# Patient Record
Sex: Male | Born: 1970 | Race: White | Hispanic: No | Marital: Married | Smoking: Never smoker
Health system: Southern US, Community
[De-identification: ages and names within clinical notes are randomized; demographics above are authoritative.]

## PROBLEM LIST (undated history)

## (undated) DIAGNOSIS — K4090 Unilateral inguinal hernia, without obstruction or gangrene, not specified as recurrent: Secondary | ICD-10-CM

## (undated) DIAGNOSIS — F32A Depression, unspecified: Secondary | ICD-10-CM

## (undated) DIAGNOSIS — F419 Anxiety disorder, unspecified: Secondary | ICD-10-CM

## (undated) DIAGNOSIS — K37 Unspecified appendicitis: Secondary | ICD-10-CM

## (undated) HISTORY — DX: Unilateral inguinal hernia, without obstruction or gangrene, not specified as recurrent: K40.90

## (undated) HISTORY — DX: Unspecified appendicitis: K37

## (undated) HISTORY — PX: INGUINAL HERNIA REPAIR: SUR1180

## (undated) HISTORY — DX: Depression, unspecified: F32.A

## (undated) HISTORY — DX: Anxiety disorder, unspecified: F41.9

## (undated) HISTORY — PX: APPENDECTOMY: SHX54

---

## 2000-01-04 ENCOUNTER — Encounter: Admission: RE | Admit: 2000-01-04 | Discharge: 2000-01-04 | Payer: Self-pay | Admitting: Family Medicine

## 2000-01-04 ENCOUNTER — Encounter: Payer: Self-pay | Admitting: Family Medicine

## 2000-03-28 ENCOUNTER — Emergency Department (HOSPITAL_COMMUNITY): Admission: EM | Admit: 2000-03-28 | Discharge: 2000-03-28 | Payer: Self-pay | Admitting: Emergency Medicine

## 2004-05-07 ENCOUNTER — Ambulatory Visit (HOSPITAL_COMMUNITY): Admission: RE | Admit: 2004-05-07 | Discharge: 2004-05-07 | Payer: Self-pay | Admitting: Orthopedic Surgery

## 2004-11-01 ENCOUNTER — Emergency Department (HOSPITAL_COMMUNITY): Admission: EM | Admit: 2004-11-01 | Discharge: 2004-11-02 | Payer: Self-pay | Admitting: Emergency Medicine

## 2004-11-05 ENCOUNTER — Encounter: Admission: RE | Admit: 2004-11-05 | Discharge: 2004-11-05 | Payer: Self-pay | Admitting: Family Medicine

## 2006-02-15 ENCOUNTER — Ambulatory Visit: Payer: Self-pay | Admitting: Family Medicine

## 2006-12-21 ENCOUNTER — Ambulatory Visit: Payer: Self-pay | Admitting: Family Medicine

## 2007-08-02 ENCOUNTER — Ambulatory Visit: Payer: Self-pay | Admitting: Family Medicine

## 2008-07-17 ENCOUNTER — Ambulatory Visit: Payer: Self-pay | Admitting: Family Medicine

## 2008-08-18 ENCOUNTER — Ambulatory Visit: Payer: Self-pay | Admitting: Family Medicine

## 2009-10-29 ENCOUNTER — Ambulatory Visit: Payer: Self-pay | Admitting: Family Medicine

## 2010-04-01 ENCOUNTER — Ambulatory Visit: Payer: Self-pay | Admitting: Family Medicine

## 2013-07-25 ENCOUNTER — Encounter: Payer: Self-pay | Admitting: Family Medicine

## 2013-07-25 ENCOUNTER — Ambulatory Visit (INDEPENDENT_AMBULATORY_CARE_PROVIDER_SITE_OTHER): Payer: BC Managed Care – PPO | Admitting: Family Medicine

## 2013-07-25 VITALS — BP 108/82 | HR 80 | Wt 222.0 lb

## 2013-07-25 DIAGNOSIS — F419 Anxiety disorder, unspecified: Secondary | ICD-10-CM

## 2013-07-25 DIAGNOSIS — D229 Melanocytic nevi, unspecified: Secondary | ICD-10-CM

## 2013-07-25 DIAGNOSIS — D239 Other benign neoplasm of skin, unspecified: Secondary | ICD-10-CM

## 2013-07-25 DIAGNOSIS — H029 Unspecified disorder of eyelid: Secondary | ICD-10-CM

## 2013-07-25 DIAGNOSIS — F341 Dysthymic disorder: Secondary | ICD-10-CM

## 2013-07-25 DIAGNOSIS — F329 Major depressive disorder, single episode, unspecified: Secondary | ICD-10-CM | POA: Insufficient documentation

## 2013-07-25 DIAGNOSIS — Z0000 Encounter for general adult medical examination without abnormal findings: Secondary | ICD-10-CM

## 2013-07-25 DIAGNOSIS — F325 Major depressive disorder, single episode, in full remission: Secondary | ICD-10-CM | POA: Insufficient documentation

## 2013-07-25 DIAGNOSIS — F32A Depression, unspecified: Secondary | ICD-10-CM

## 2013-07-25 LAB — CBC WITH DIFFERENTIAL/PLATELET
Basophils Absolute: 0.1 10*3/uL (ref 0.0–0.1)
Basophils Relative: 1 % (ref 0–1)
Eosinophils Absolute: 0.1 10*3/uL (ref 0.0–0.7)
Eosinophils Relative: 1 % (ref 0–5)
HCT: 46.7 % (ref 39.0–52.0)
Hemoglobin: 16.6 g/dL (ref 13.0–17.0)
Lymphocytes Relative: 39 % (ref 12–46)
Lymphs Abs: 2.8 10*3/uL (ref 0.7–4.0)
MCH: 30.5 pg (ref 26.0–34.0)
MCHC: 35.5 g/dL (ref 30.0–36.0)
MCV: 85.7 fL (ref 78.0–100.0)
Monocytes Absolute: 0.7 10*3/uL (ref 0.1–1.0)
Monocytes Relative: 10 % (ref 3–12)
Neutro Abs: 3.5 10*3/uL (ref 1.7–7.7)
Neutrophils Relative %: 49 % (ref 43–77)
Platelets: 300 10*3/uL (ref 150–400)
RBC: 5.45 MIL/uL (ref 4.22–5.81)
RDW: 13.9 % (ref 11.5–15.5)
WBC: 7.2 10*3/uL (ref 4.0–10.5)

## 2013-07-25 LAB — COMPREHENSIVE METABOLIC PANEL
ALT: 36 U/L (ref 0–53)
AST: 24 U/L (ref 0–37)
Albumin: 4.5 g/dL (ref 3.5–5.2)
Alkaline Phosphatase: 86 U/L (ref 39–117)
BUN: 17 mg/dL (ref 6–23)
CO2: 29 mEq/L (ref 19–32)
Calcium: 9.8 mg/dL (ref 8.4–10.5)
Chloride: 101 mEq/L (ref 96–112)
Creat: 0.87 mg/dL (ref 0.50–1.35)
Glucose, Bld: 91 mg/dL (ref 70–99)
Potassium: 4.5 mEq/L (ref 3.5–5.3)
Sodium: 138 mEq/L (ref 135–145)
Total Bilirubin: 0.7 mg/dL (ref 0.2–1.2)
Total Protein: 7.8 g/dL (ref 6.0–8.3)

## 2013-07-25 LAB — HEMOCCULT GUIAC POC 1CARD (OFFICE)

## 2013-07-25 LAB — LIPID PANEL
CHOL/HDL RATIO: 4.5 ratio
CHOLESTEROL: 170 mg/dL (ref 0–200)
HDL: 38 mg/dL — AB (ref >39)
LDL Cholesterol: 114 mg/dL — ABNORMAL HIGH (ref 0–99)
Triglycerides: 90 mg/dL (ref <150)
VLDL: 18 mg/dL (ref 0–40)

## 2013-07-25 NOTE — Progress Notes (Signed)
   Subjective:    Patient ID: Tyrone Graham, male    DOB: Feb 28, 1971, 43 y.o.   MRN: [id number]  HPI He is here for complete examination. He has not been here in several years but he does get routine followup for his underlying anxiety/depression. He continues to do quite well on his Zoloft. He also has a lesion present on his upper eyelid in several skin lesions he would like evaluated. He otherwise has no concerns or questions. His family and social history were reviewed. Marriage is going well. He has 2 daughters.   Review of Systems  All other systems reviewed and are negative.       Objective:   Physical Exam BP 108/82  Pulse 80  Wt 222 lb (100.699 kg)  General Appearance:    Alert, cooperative, no distress, appears stated age  Head:    Normocephalic, without obvious abnormality, atraumatic  Eyes:    P a 2-5 mm lesion is noted on the left upper eyelid at the eyelash level.ERRL, conjunctiva/corneas clear, EOM's intact, fundi    Benign.  Ears:    Normal TM's and external ear canals  Nose:   Nares normal, mucosa normal, no drainage or sinus   tenderness  Throat:   Lips, mucosa, and tongue normal; teeth and gums normal  Neck:   Supple, no lymphadenopathy;  thyroid:  no   enlargement/tenderness/nodules; no carotid   bruit or JVD  Back:    Spine nontender, no curvature, ROM normal, no CVA     tenderness  Lungs:     Clear to auscultation bilaterally without wheezes, rales or     ronchi; respirations unlabored  Chest Wall:    No tenderness or deformity   Heart:    Regular rate and rhythm, S1 and S2 normal, no murmur, rub   or gallop  Breast Exam:    No chest wall tenderness, masses or gynecomastia  Abdomen:     Soft, non-tender, nondistended, normoactive bowel sounds,    no masses, no hepatosplenomegaly  Genitalia:    Normal male external genitalia without lesions.  Testicles without masses.  No inguinal hernias.  Rectal:    Normal sphincter tone, no masses or tenderness;  guaiac negative stool.  Prostate smooth, no nodules, not enlarged.  Extremities:   No clubbing, cyanosis or edema  Pulses:   2+ and symmetric all extremities  Skin:   Skin color, texture, turgor normal, no rashes, multiple moles were evaluated and are benign   Lymph nodes:   Cervical, supraclavicular, and axillary nodes normal  Neurologic:   CNII-XII intact, normal strength, sensation and gait; reflexes 2+ and symmetric throughout          Psych:   Normal mood, affect, hygiene and grooming.          Assessment & Plan:  Routine general medical examination at a health care facility - Plan: CBC with Differential, Comprehensive metabolic panel, Lipid panel, Hemoccult - 1 Card (office)  Lesion of upper eyelid - Plan: Ambulatory referral to Ophthalmology  Benign mole  Anxiety and depression  he will continue on his present medications. Discussed the possibility of switching to my care concerning the renewal of Zoloft and he will consider this.

## 2015-01-09 ENCOUNTER — Ambulatory Visit (INDEPENDENT_AMBULATORY_CARE_PROVIDER_SITE_OTHER): Payer: 59 | Admitting: Family Medicine

## 2015-01-09 ENCOUNTER — Encounter: Payer: Self-pay | Admitting: Family Medicine

## 2015-01-09 VITALS — BP 112/70 | HR 81 | Wt 225.0 lb

## 2015-01-09 DIAGNOSIS — R0781 Pleurodynia: Secondary | ICD-10-CM | POA: Diagnosis not present

## 2015-01-09 NOTE — Progress Notes (Signed)
   Subjective:    Patient ID: Tyrone Graham, male    DOB: 28-Mar-1971, 44 y.o.   MRN: [id number]  HPI He awoke this morning with left upper quadrant pain. The pain is made worse with deep breathing as well as left lateral motion. He does not remember any injury. No fever, chills, chest congestion, nausea, vomiting or diarrhea.   Review of Systems     Objective:   Physical Exam Urgent and in no distress. Cardiac exam shows regular rhythm without murmurs or gallops. Lungs are clear to auscultation. No chest wall tenderness however splinting of the left lower ribs did relieve his discomfort. He did note slight discomfort with breathing regularly. No abdominal tenderness.       Assessment & Plan:  Rib pain on left side  I explained that the splinting of the ribs did indicate some musculoskeletal type of problem rather than anything significant. Recommend anti-inflammatory of choice and call if further trouble.

## 2015-12-10 ENCOUNTER — Encounter: Payer: Self-pay | Admitting: Family Medicine

## 2015-12-10 ENCOUNTER — Ambulatory Visit (INDEPENDENT_AMBULATORY_CARE_PROVIDER_SITE_OTHER): Payer: 59 | Admitting: Family Medicine

## 2015-12-10 VITALS — BP 124/80 | HR 84 | Wt 214.6 lb

## 2015-12-10 DIAGNOSIS — R29898 Other symptoms and signs involving the musculoskeletal system: Secondary | ICD-10-CM

## 2015-12-10 NOTE — Progress Notes (Signed)
   Subjective:    Patient ID: Tyrone Graham, male    DOB: [birth year], 45 y.o.   MRN: [id number]  HPI He complains of a 10 day history of the feeling is that the left leg is heavy. He has had no change in his physical activities. He has had no numbness, tingling, weakness, falls, back pain, chest pain, shortness of breath. He has been able to play tennis without any difficulties including falling.   Review of Systems     Objective:   Physical Exam Alert and in no distress. Full hip, knee and ankle motion. Negative straight leg raising. Strength and sensation is normal. Pulses are normal. DTRs normal.       Assessment & Plan:  Left leg weakness I reassured him that I found nothing in particular to worry about. Explained all the things that he did not have. Recommend watchful waiting and patient attention to any symptoms that might possibly change.

## 2016-01-07 ENCOUNTER — Encounter: Payer: Self-pay | Admitting: Family Medicine

## 2016-01-07 ENCOUNTER — Ambulatory Visit (INDEPENDENT_AMBULATORY_CARE_PROVIDER_SITE_OTHER): Payer: 59 | Admitting: Family Medicine

## 2016-01-07 VITALS — BP 104/70 | HR 83 | Wt 209.0 lb

## 2016-01-07 DIAGNOSIS — R29898 Other symptoms and signs involving the musculoskeletal system: Secondary | ICD-10-CM | POA: Diagnosis not present

## 2016-01-07 NOTE — Progress Notes (Signed)
   Subjective:    Patient ID: Tyrone Graham, male    DOB: 10/26/1970, 45 y.o.   MRN: [id number]  HPI He is here again complaining of a tight sensation in the left leg . He has had no blurred vision, double vision, nausea, headache, bowel or bladder trouble or decreased functional ability. He states that he is able to play tennis without difficulty. He did have one episode of being lightheaded. This is now starting to psychologically way on him in that he is starting to play the "what if" game  Review of Systems     Objective:   Physical Exam Alert and in no distress. EOMI. Other renal nerves grossly intact. DTRs and motor normal. Pulses normal.       Assessment & Plan:  Left leg weakness - Plan: Ambulatory referral to Neurology As he continues to have a concern about this, I will refer to neurology for another opinion. I again tried to reassure him that since he is able to function without difficulty and there are no focal neurologic findings I don't think that he is in any danger.

## 2016-01-11 ENCOUNTER — Encounter: Payer: Self-pay | Admitting: Family Medicine

## 2016-02-01 ENCOUNTER — Ambulatory Visit (INDEPENDENT_AMBULATORY_CARE_PROVIDER_SITE_OTHER): Payer: 59 | Admitting: Neurology

## 2016-02-01 ENCOUNTER — Encounter: Payer: Self-pay | Admitting: Neurology

## 2016-02-01 VITALS — BP 110/70 | HR 78 | Ht 75.0 in | Wt 212.3 lb

## 2016-02-01 DIAGNOSIS — F418 Other specified anxiety disorders: Secondary | ICD-10-CM

## 2016-02-01 DIAGNOSIS — M62838 Other muscle spasm: Secondary | ICD-10-CM

## 2016-02-01 NOTE — Progress Notes (Signed)
[facility] Clinic Note - Initial Visit   Date: 02/01/16  Tyrone Graham MRN: [id number] DOB: 1970-09-05   Dear Dr. Redmond School:  Thank you for your kind referral of Tyrone Graham for consultation of left leg weakness. Although his history is well known to you, please allow [place] to reiterate it for the purpose of our medical record. The patient was accompanied to the clinic by self.   History of Present Illness: Tyrone Graham is a 45 y.o. right-handed Caucasian male with no prior medical problems presenting for evaluation of left leg weakness.    Starting around early June, he began having a tight sensation of the left lower leg.  He does not have any pain, tingling/numbness, weakness, cramps, or bowel/bladder problems.  Symptoms are constant, no identifiable triggers. He has noticed that when he is distracted, he does not notice the symptoms.  He initially thought it may be because he stands a lot at work.  He feels that his walking is off, but denies imbalance.  No falls. He stays active playing tennis 1-2 times per week, which does not have any effect on his symptoms.    He reports witnessing a colleague have a seizures at work earlier this year and it has been bothering him.  He admits to thinking a lot about his leg and researching symptoms online. He is worried about a brain tumor causing his leg discomfort.  He has previously seen behavior therapy several years ago and recognizes that he has a lot of anxiety about symptoms.   Out-side paper records, electronic medical record, and images have been reviewed where available and summarized as:  Lab Results  Component Value Date   NA 138 07/25/2013   K 4.5 07/25/2013   CL 101 07/25/2013   CO2 29 07/25/2013   Lab Results  Component Value Date   WBC 7.2 07/25/2013   HGB 16.6 07/25/2013   HCT 46.7 07/25/2013   MCV 85.7 07/25/2013   PLT 300 07/25/2013    Past Medical History:  Diagnosis  Date  . Appendicitis     Past Surgical History:  Procedure Laterality Date  . APPENDECTOMY       Medications:  No outpatient encounter prescriptions on file as of 02/01/2016.   No facility-administered encounter medications on file as of 02/01/2016.      Allergies: No Known Allergies  Family History: Family History  Problem Relation Age of Onset  . Mental illness Mother   . Diabetes Father   . Heart disease Father   . Healthy Daughter     Social History: Social History  Substance Use Topics  . Smoking status: Never Smoker  . Smokeless tobacco: Never Used  . Alcohol use Yes     Comment: rare   Social History   Social History Narrative   Lives with wife in a one story home.  Has 2 children.     Works as a Financial planner.     Education: college.    Review of Systems:  CONSTITUTIONAL: No fevers, chills, night sweats, or weight loss.   EYES: No visual changes or eye pain ENT: No hearing changes.  No history of nose bleeds.   RESPIRATORY: No cough, wheezing and shortness of breath.   CARDIOVASCULAR: Negative for chest pain, and palpitations.   GI: Negative for abdominal discomfort, blood in stools or black stools.  No recent change in bowel habits.   GU:  No history of incontinence.   MUSCLOSKELETAL: No  history of joint pain or swelling.  No myalgias.   SKIN: Negative for lesions, rash, and itching.   HEMATOLOGY/ONCOLOGY: Negative for prolonged bleeding, bruising easily, and swollen nodes.  No history of cancer.   ENDOCRINE: Negative for cold or heat intolerance, polydipsia or goiter.   PSYCH:  No depression + anxiety symptoms.   NEURO: As Above.   Vital Signs:  BP 110/70   Pulse 78   Ht 6\' 3"  (1.905 m)   Wt 212 lb 5 oz (96.3 kg)   SpO2 97%   BMI 26.54 kg/m  Pain Scale: 0 on a scale of 0-10   General Medical Exam:   General:  Well appearing, comfortable.   Eyes/ENT: see cranial nerve examination.   Neck: No masses appreciated.  Full range of motion  without tenderness.  No carotid bruits. Respiratory:  Clear to auscultation, good air entry bilaterally.   Cardiac:  Regular rate and rhythm, no murmur.   Extremities:  No deformities, edema, or skin discoloration.  Skin:  No rashes or lesions.  Neurological Exam: MENTAL STATUS including orientation to time, place, person, recent and remote memory, attention span and concentration, language, and fund of knowledge is normal.  Speech is not dysarthric.  CRANIAL NERVES: II:  No visual field defects.  Unremarkable fundi.   III-IV-VI: Pupils equal round and reactive to light.  Normal conjugate, extra-ocular eye movements in all directions of gaze.  No nystagmus.  No ptosis.   V:  Normal facial sensation.     VII:  Normal facial symmetry and movements.   VIII:  Normal hearing and vestibular function.   IX-X:  Normal palatal movement.   XI:  Normal shoulder shrug and head rotation.   XII:  Normal tongue strength and range of motion, no deviation or fasciculation.  MOTOR:  No atrophy, fasciculations or abnormal movements. No muscle tenderness.  No pronator drift.  Tone is normal.    Right Upper Extremity:    Left Upper Extremity:    Deltoid  5/5   Deltoid  5/5   Biceps  5/5   Biceps  5/5   Triceps  5/5   Triceps  5/5   Wrist extensors  5/5   Wrist extensors  5/5   Wrist flexors  5/5   Wrist flexors  5/5   Finger extensors  5/5   Finger extensors  5/5   Finger flexors  5/5   Finger flexors  5/5   Dorsal interossei  5/5   Dorsal interossei  5/5   Abductor pollicis  5/5   Abductor pollicis  5/5   Tone (Ashworth scale)  0  Tone (Ashworth scale)  0   Right Lower Extremity:    Left Lower Extremity:    Hip flexors  5/5   Hip flexors  5/5   Hip extensors  5/5   Hip extensors  5/5   Knee flexors  5/5   Knee flexors  5/5   Knee extensors  5/5   Knee extensors  5/5   Dorsiflexors  5/5   Dorsiflexors  5/5   Plantarflexors  5/5   Plantarflexors  5/5   Toe extensors  5/5   Toe extensors  5/5   Toe  flexors  5/5   Toe flexors  5/5   Tone (Ashworth scale)  0  Tone (Ashworth scale)  0   MSRs:  Right  Left brachioradialis 2+  brachioradialis 2+  biceps 2+  biceps 2+  triceps 2+  triceps 2+  patellar 2+  patellar 2+  ankle jerk 2+  ankle jerk 2+  Hoffman no  Hoffman no  plantar response down  plantar response down   SENSORY:  Normal and symmetric perception of light touch, pinprick, vibration, and proprioception.  Romberg's sign absent.   COORDINATION/GAIT: Normal finger-to- nose-finger and heel-to-shin.  Intact rapid alternating movements bilaterally.  Able to rise from a chair without using arms.  Gait narrow based and stable. Tandem and stressed gait intact.    IMPRESSION: Mr. Farar is a 45 year-old gentleman referred for evaluation of left leg discomfort/tightness.  I reassured patient that his neurological exam is entirely normal and non-focal.  There is nothing worrisome on his exam or history to suggest that he has any intracranial pathology.  He certainly may have muscle spasm of the leg, but there is a much greater overlay of anxiety contributing to his presentation.  Witnessing a colleague have a seizure can be traumatizing to see and I suspect some degree of his somatic complaints maybe stemming from this event. Patient does agree that he is axnious about symptoms and is interested in seeing his counselor again.  In the meantime, I recommended that he stay well hydrated and start doing posterior leg stretches to help with leg discomfort. I do not see any reason to pursue additional testing such as NCS/EMG or MRI.  If his symptoms worsen or he develops new changes, I am happy to re-evaluate him.   The duration of this appointment visit was 40 minutes of face-to-face time with the patient.  Greater than 50% of this time was spent in counseling, explanation of diagnosis, planning of further management, and coordination  of care.   Thank you for allowing me to participate in patient's care.  If I can answer any additional questions, I would be pleased to do so.    Sincerely,    Jonathen Rathman K. Posey Pronto, DO

## 2016-02-01 NOTE — Patient Instructions (Addendum)
1.  Recommend you see counselor for anxiety 2.  Come back and see if me your symptoms get worse

## 2016-03-08 ENCOUNTER — Ambulatory Visit (INDEPENDENT_AMBULATORY_CARE_PROVIDER_SITE_OTHER): Payer: 59 | Admitting: Family Medicine

## 2016-03-08 ENCOUNTER — Encounter: Payer: Self-pay | Admitting: Family Medicine

## 2016-03-08 VITALS — BP 112/74 | HR 80 | Ht 75.25 in | Wt 202.8 lb

## 2016-03-08 DIAGNOSIS — F411 Generalized anxiety disorder: Secondary | ICD-10-CM | POA: Diagnosis not present

## 2016-03-08 DIAGNOSIS — R634 Abnormal weight loss: Secondary | ICD-10-CM

## 2016-03-08 DIAGNOSIS — Z8042 Family history of malignant neoplasm of prostate: Secondary | ICD-10-CM | POA: Diagnosis not present

## 2016-03-08 LAB — COMPREHENSIVE METABOLIC PANEL
ALK PHOS: 78 U/L (ref 40–115)
ALT: 22 U/L (ref 9–46)
AST: 20 U/L (ref 10–40)
Albumin: 4.3 g/dL (ref 3.6–5.1)
BUN: 16 mg/dL (ref 7–25)
CO2: 25 mmol/L (ref 20–31)
CREATININE: 0.85 mg/dL (ref 0.60–1.35)
Calcium: 9.5 mg/dL (ref 8.6–10.3)
Chloride: 103 mmol/L (ref 98–110)
Glucose, Bld: 91 mg/dL (ref 65–99)
Potassium: 4.7 mmol/L (ref 3.5–5.3)
SODIUM: 137 mmol/L (ref 135–146)
Total Bilirubin: 0.8 mg/dL (ref 0.2–1.2)
Total Protein: 7.6 g/dL (ref 6.1–8.1)

## 2016-03-08 LAB — CBC WITH DIFFERENTIAL/PLATELET
Basophils Absolute: 66 cells/uL (ref 0–200)
Basophils Relative: 1 %
EOS PCT: 2 %
Eosinophils Absolute: 132 cells/uL (ref 15–500)
HCT: 46.8 % (ref 38.5–50.0)
Hemoglobin: 16.5 g/dL (ref 13.2–17.1)
LYMPHS PCT: 32 %
Lymphs Abs: 2112 cells/uL (ref 850–3900)
MCH: 30.6 pg (ref 27.0–33.0)
MCHC: 35.3 g/dL (ref 32.0–36.0)
MCV: 86.7 fL (ref 80.0–100.0)
MPV: 10.7 fL (ref 7.5–12.5)
Monocytes Absolute: 594 cells/uL (ref 200–950)
Monocytes Relative: 9 %
NEUTROS PCT: 56 %
Neutro Abs: 3696 cells/uL (ref 1500–7800)
Platelets: 280 10*3/uL (ref 140–400)
RBC: 5.4 MIL/uL (ref 4.20–5.80)
RDW: 13.3 % (ref 11.0–15.0)
WBC: 6.6 10*3/uL (ref 4.0–10.5)

## 2016-03-08 LAB — TSH: TSH: 1.25 m[IU]/L (ref 0.40–4.50)

## 2016-03-08 LAB — PSA: PSA: 0.8 ng/mL (ref <=4.0)

## 2016-03-08 NOTE — Progress Notes (Signed)
Chief Complaint  Patient presents with  . Advice Only    weightloss and doesn't feel right. Feels "foggy," not dizzy. Saw neuro- did not have any scans or labs, would like to maybe have some labs done today.   . Flu Vaccine    declined.   Patient presents for evaluation of weight loss.  He has had 12# weight loss in the last 2-3 months; down 22# from 1 year ago.  He plays tennis once a week.  Denies any intentional weight loss.  He does admit some decreased appetite--sometimes feels tired, run down. He admits to some anxiety/worry about the strange feeling in his left leg.  Denies hair/skin/bowel changes. Sometimes he feels like his head is in a fog.  Denies allergy symptoms. Sometimes he has some very mild vertigo, feels better after eating/drinking Drinks 1 coke/day, no coffee He no longer drinks green tea (used to)--he had switched from diet soda to green tea  Denies polydipsia, polyuria  Sometimes he feels a flushing in his whole body, including his chest (when he is worrying about things).  He has had some panic attacks in the past. At one point was treated with clonazepam.  No panic attacks in over a month.  Last week was last episode of the flushing, increased anxiety.  Stressors--moving, buying new house, child not doing homework (46yo)  Took sertraline for many years for anxiety, weaned off last year.  (up to 189m at one point). Has been off since 05/2015. Stopped because he was doing well for so long. rx'd by psychiatrist (Dr. MArvil Personsoffice, multiple providers), no longer goes there  PMH, PMercedes SGrinnelland FH were reviewed and updated in epic.  Outpatient Encounter Prescriptions as of 03/08/2016  Medication Sig  . Multiple Vitamins-Minerals (MULTIVITAMIN WITH MINERALS) tablet Take 1 tablet by mouth daily.   No facility-administered encounter medications on file as of 03/08/2016.    No Known Allergies  ROS negative otherwise--no fever, chills, URI/allergy symptoms, cough,  shortness of breath, chest pain (except as stated above with anxiety), nausea, vomiting, bowel changes, skin/hair changes, denies depression.  +anxiety. No bleeding, bruising, rash, joint pains.  +ongoing problem with LLE, not worsening.  PHYSICAL EXAM:  BP 112/74 (BP Location: Left Arm, Patient Position: Sitting, Cuff Size: Normal)   Pulse 80   Ht 6' 3.25" (1.911 m)   Wt 202 lb 12.8 oz (92 kg)   BMI 25.18 kg/m   Pleasant, well-appearing male in no distress.  Only appears very mildly anxious HEENT: PERRL, EOMI, conjunctiva and sclera are clear, TM's and EAC's normal, nasal mucosa normal, OP is clear. Sinuses nontender Neck: no lymphadenopathy, thyromegaly or carotid bruit Heart: regular rate and rhythm, no murmur Lungs: clear bilaterally Back: no spinal or CVA tenderness Chest: no gynecomastia, tenderness or masses Abdomen: soft, nontender, no organomegaly or mass Extremities: no edema, normal pulses Psych: normal mood--slightly anxious.  Normal hygiene, grooming, speech and eye contact Neuro: alert and oriented, cranial nerves intact, normal strength, gait  ASSESSMENT/PLAN:  Unexplained weight loss - do not suspect underlying problem based on hx/exam--r/o thyroid disease, basic screens. May have lost some after stopping sertraline - Plan: Comprehensive metabolic panel, CBC with Differential/Platelet, VITAMIN D 25 Hydroxy (Vit-D Deficiency, Fractures), TSH, PSA  Generalized anxiety disorder - encouraged him to consider restarting daily medication (restart sertraline, vs weight-neutral med such as lexapro).  He will consider this  Family history of prostate cancer - Plan: PSA   CBC, c-met (fasting today), TSH, Vitamin D, PSA  Discussed his generalized anxiety--recurrent after being off SSRI. Discussed restarting sertraline, vs change to a weight-neutral med (ie lexapro), since perhaps it contributed to some weight gain (and hence the loss after stopping). He would like to think  about this.  Refuses flu shot Denies HIV or STD screen (screened when younger)  25-30 min visit, more than 1/2 spent counseling 

## 2016-03-08 NOTE — Patient Instructions (Signed)
We will contact you with your results in 1-2 days through Tyrone Graham. If tests do not reveal any abnormality that could be contributing to your weight loss, then let's focus on caloric intake (making sure that you are eating adequate amounts--at least 1800 calories/day, even if you don't feel hungry).  I do think that anxiety is contributing to your overall lack of sense of well-being, and that you should consider re-starting medication.  Whether it be restarting the sertraline (which may potentially cause weight gain, and possibly contributed to some of the losses after stopping it) versus starting a weight-neutral medication such as the generic for lexapro or celexa.

## 2016-03-09 ENCOUNTER — Ambulatory Visit: Payer: Self-pay | Admitting: Neurology

## 2016-03-09 LAB — VITAMIN D 25 HYDROXY (VIT D DEFICIENCY, FRACTURES): VIT D 25 HYDROXY: 27 ng/mL — AB (ref 30–100)

## 2016-08-31 ENCOUNTER — Telehealth: Payer: Self-pay | Admitting: Family Medicine

## 2016-08-31 ENCOUNTER — Other Ambulatory Visit: Payer: Self-pay | Admitting: Medical

## 2016-08-31 MED ORDER — OSELTAMIVIR PHOSPHATE 75 MG PO CAPS: 75.0000 mg | ORAL_CAPSULE | Freq: Every day | ORAL | 0 refills | Status: DC

## 2016-08-31 NOTE — Telephone Encounter (Signed)
Med sent.

## 2016-08-31 NOTE — Telephone Encounter (Signed)
Daughter tested positive for flu.  Pt's elderly father who recently had heart attach is living with them and family is being treated with Tamiflu for preventative and pt request Tamiflu to be sent in for himself for preventative to [facility] in [employer] on [address].

## 2016-08-31 NOTE — Progress Notes (Signed)
tamifl

## 2017-10-24 ENCOUNTER — Encounter: Payer: Self-pay | Admitting: Family Medicine

## 2017-10-24 ENCOUNTER — Ambulatory Visit (INDEPENDENT_AMBULATORY_CARE_PROVIDER_SITE_OTHER): Payer: BLUE CROSS/BLUE SHIELD | Admitting: Family Medicine

## 2017-10-24 VITALS — BP 102/74 | HR 80 | Temp 98.1°F | Ht 75.0 in | Wt 208.2 lb

## 2017-10-24 DIAGNOSIS — K2900 Acute gastritis without bleeding: Secondary | ICD-10-CM

## 2017-10-24 NOTE — Progress Notes (Signed)
   Subjective:    Patient ID: Tyrone Graham, male    DOB: 1970-07-08, 47 y.o.   MRN: [id number]  HPI He complains of a one-week history of burning sensation in his gastric area.  No associated nausea, abdominal pain, vomiting, diarrhea.  He cannot state whether food makes his any better or worse.  He does not smoke and rarely drinks.  He did try one Zantac and is not sure whether it helped much.   Review of Systems     Objective:   Physical Exam Alert and in no distress.  Cardiac exam shows regular rhythm without murmurs or gallops.  Lungs are clear to auscultation.  Abdominal exam shows normal bowel sounds without masses or tenderness.       Assessment & Plan:  Acute gastritis without hemorrhage, unspecified gastritis type    Recommend he try Prilosec 40 mg daily and also keep track of any foods that might make this better or worse or any other symptoms and he is to call in 1 week if any problems. Also discussed coming back for complete examination.

## 2017-10-24 NOTE — Patient Instructions (Signed)
Take 2 Prilosec daily and pay attention to see if foods make a difference one way or the other and also pay attention to if there is any other symptoms

## 2017-11-07 ENCOUNTER — Encounter: Payer: Self-pay | Admitting: Family Medicine

## 2017-11-30 ENCOUNTER — Encounter: Payer: Self-pay | Admitting: Medical

## 2017-11-30 ENCOUNTER — Ambulatory Visit (INDEPENDENT_AMBULATORY_CARE_PROVIDER_SITE_OTHER): Payer: BLUE CROSS/BLUE SHIELD | Admitting: Medical

## 2017-11-30 VITALS — BP 102/78 | HR 78 | Temp 98.2°F | Wt 204.0 lb

## 2017-11-30 DIAGNOSIS — J029 Acute pharyngitis, unspecified: Secondary | ICD-10-CM | POA: Diagnosis not present

## 2017-11-30 DIAGNOSIS — R05 Cough: Secondary | ICD-10-CM | POA: Diagnosis not present

## 2017-11-30 DIAGNOSIS — R0981 Nasal congestion: Secondary | ICD-10-CM

## 2017-11-30 DIAGNOSIS — R059 Cough, unspecified: Secondary | ICD-10-CM

## 2017-11-30 LAB — POCT RAPID STREP A (OFFICE): RAPID STREP A SCREEN: NEGATIVE

## 2017-11-30 NOTE — Progress Notes (Signed)
Subjective: Chief Complaint  Patient presents with  . Sore Throat    x2 days,hoarse with congestion and cough    Here for sore throat, congestion, cough.   someone was burning leaves close to where he was playing tennis.  That night started having symptoms, hoarse throat.  Coughing up some productive mucous   Wife wanted him checked out.  Rarely gets sick.  No fever, no chills, no body aches, no NVD. No strep or sick contacts.  No other aggravating or relieving factors. No other complaint.  Past Medical History:  Diagnosis Date  . Appendicitis    Current Outpatient Medications on File Prior to Visit  Medication Sig Dispense Refill  . Multiple Vitamins-Minerals (MULTIVITAMIN WITH MINERALS) tablet Take 1 tablet by mouth daily.     No current facility-administered medications on file prior to visit.    ROS as in subjective    Objective: BP 102/78   Pulse 78   Temp 98.2 F (36.8 C) (Oral)   Wt 204 lb (92.5 kg)   SpO2 97%   BMI 25.50 kg/m   Wt Readings from Last 3 Encounters:  11/30/17 204 lb (92.5 kg)  10/24/17 208 lb 3.2 oz (94.4 kg)  03/08/16 202 lb 12.8 oz (92 kg)   General appearance: alert, no distress, WD/WN,  HEENT: normocephalic, sclerae anicteric, TMs pearly, nares patent,+ mucoid discharge , + erythema, pharynx with +erythema Oral cavity: MMM, no lesions Neck: supple, no lymphadenopathy, no thyromegaly, no masses Lungs: CTA bilaterally, no wheezes, rhonchi, or rales    Assessment: Encounter Diagnoses  Name Primary?  . Sore throat Yes  . Congestion of nasal sinus   . Cough      Plan: Discussed signs and symptoms, suggestive of head congestion and drainage related to recent smoke and dust irritant.  Advised rest, hydration, can use Benadryl or Mucinex DM OTC.  Can use salt water gargles, warm fluids for sore throat.  If worse or not improving in the next week, then call or return.    Tyrone Graham was seen today for sore throat.  Diagnoses and all orders for  this visit:  Sore throat -     POCT rapid strep A  Congestion of nasal sinus  Cough

## 2017-12-04 ENCOUNTER — Ambulatory Visit (INDEPENDENT_AMBULATORY_CARE_PROVIDER_SITE_OTHER): Payer: BLUE CROSS/BLUE SHIELD | Admitting: Medical

## 2017-12-04 VITALS — BP 110/80 | HR 77 | Temp 98.3°F | Resp 16 | Ht 75.0 in | Wt 205.8 lb

## 2017-12-04 DIAGNOSIS — R05 Cough: Secondary | ICD-10-CM | POA: Diagnosis not present

## 2017-12-04 DIAGNOSIS — R059 Cough, unspecified: Secondary | ICD-10-CM

## 2017-12-04 DIAGNOSIS — T7589XS Other specified effects of external causes, sequela: Secondary | ICD-10-CM

## 2017-12-04 DIAGNOSIS — IMO0001 Reserved for inherently not codable concepts without codable children: Secondary | ICD-10-CM

## 2017-12-04 DIAGNOSIS — J988 Other specified respiratory disorders: Secondary | ICD-10-CM

## 2017-12-04 MED ORDER — AMOXICILLIN 875 MG PO TABS: 875.0000 mg | ORAL_TABLET | Freq: Two times a day (BID) | ORAL | 0 refills | Status: DC

## 2017-12-04 MED ORDER — PREDNISONE 20 MG PO TABS: ORAL_TABLET | ORAL | 0 refills | Status: DC
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
## 2017-12-08 ENCOUNTER — Encounter: Payer: Self-pay | Admitting: Medical

## 2017-12-19 ENCOUNTER — Ambulatory Visit (INDEPENDENT_AMBULATORY_CARE_PROVIDER_SITE_OTHER): Payer: BLUE CROSS/BLUE SHIELD | Admitting: Family Medicine

## 2017-12-19 ENCOUNTER — Encounter: Payer: Self-pay | Admitting: Family Medicine

## 2017-12-19 VITALS — BP 108/78 | HR 74 | Temp 98.0°F | Ht 74.75 in | Wt 203.6 lb

## 2017-12-19 DIAGNOSIS — R002 Palpitations: Secondary | ICD-10-CM

## 2017-12-19 DIAGNOSIS — L309 Dermatitis, unspecified: Secondary | ICD-10-CM

## 2017-12-19 DIAGNOSIS — I781 Nevus, non-neoplastic: Secondary | ICD-10-CM

## 2017-12-19 DIAGNOSIS — Z23 Encounter for immunization: Secondary | ICD-10-CM

## 2017-12-19 DIAGNOSIS — Z0000 Encounter for general adult medical examination without abnormal findings: Secondary | ICD-10-CM | POA: Diagnosis not present

## 2017-12-19 DIAGNOSIS — D1801 Hemangioma of skin and subcutaneous tissue: Secondary | ICD-10-CM

## 2017-12-19 LAB — POCT URINALYSIS DIP (PROADVANTAGE DEVICE)
BILIRUBIN UA: NEGATIVE mg/dL
Bilirubin, UA: NEGATIVE
Glucose, UA: NEGATIVE mg/dL
Leukocytes, UA: NEGATIVE
Nitrite, UA: NEGATIVE
PH UA: 6 (ref 5.0–8.0)
Protein Ur, POC: NEGATIVE mg/dL
RBC UA: NEGATIVE
Specific Gravity, Urine: 1.025
Urobilinogen, Ur: 3.5

## 2017-12-19 NOTE — Progress Notes (Signed)
Subjective:    Patient ID: Tyrone Graham, male    DOB: 1970-09-07, 47 y.o.   MRN: [id number]  HPI He is here for complete examination.  He has had some intermittent difficulty with pain in his left foot over the ball of foot as well as left index finger.  Both these are very intermittent in nature.  He also has redness lesions on his chest that he has questions about.  He also notes difficulty with eczema mainly with a lesion present on his chin.  He does complain of a fluttering sensation in his chest that is very intermittent in nature.lasts just a few seconds and he has not checked his pulse to verify this.  His father died at age 69 of congestive heart failure but also had a history of diabetes.  He has no other concerns or complaints.  Family and social history as well as health maintenance and immunizations was.  He does have an uncle and cousin who did have prostate cancer.   Review of Systems  All other systems reviewed and are negative.      Objective:   Physical Exam BP 108/78 (BP Location: Left Arm, Patient Position: Sitting)   Pulse 74   Temp 98 F (36.7 C)   Ht 6' 2.75" (1.899 m)   Wt 203 lb 9.6 oz (92.4 kg)   SpO2 95%   BMI 25.62 kg/m   General Appearance:    Alert, cooperative, no distress, appears stated age  Head:    Normocephalic, without obvious abnormality, atraumatic  Eyes:    PERRL, conjunctiva/corneas clear, EOM's intact, fundi    benign  Ears:    Normal TM's and external ear canals  Nose:   Nares normal, mucosa normal, no drainage or sinus   tenderness  Throat:   Lips, mucosa, and tongue normal; teeth and gums normal  Neck:   Supple, no lymphadenopathy;  thyroid:  no   enlargement/tenderness/nodules; no carotid   bruit or JVD     Lungs:     Clear to auscultation bilaterally without wheezes, rales or     ronchi; respirations unlabored      Heart:    Regular rate and rhythm, S1 and S2 normal, no murmur, rub   or gallop     Abdomen:     Soft,  non-tender, nondistended, normoactive bowel sounds,    no masses, no hepatosplenomegaly  Genitalia:    Normal male external genitalia without lesions.  Testicles without masses.  No inguinal hernias.  Rectal:   Deferred.  Guaiac cards given  Extremities:   No clubbing, cyanosis or edema  Pulses:   2+ and symmetric all extremities  Skin:   Skin color, texture, turgor normal, reddish well-demarcated lesions are noted over his abdomen and chest.  Dry area to the right side of his chin is noted.  Lymph nodes:   Cervical, supraclavicular, and axillary nodes normal  Neurologic:   CNII-XII intact, normal strength, sensation and gait; reflexes 2+ and symmetric throughout          Psych:   Normal mood, affect, hygiene and grooming.   EKG is normal.       Assessment & Plan:  Routine general medical examination at a health care facility - Plan: EKG 12-Lead, CBC with Differential/Platelet, Comprehensive metabolic panel, Lipid panel  Eczema, unspecified type  Cherry angioma  Need for Tdap vaccination - Plan: Tdap vaccine greater than or equal to 7yo IM  Fluttering sensation of heart - Plan:  EKG 12-Lead Recommend cortisone cream for the eczema.  Discussed cherry angiomas and explained that they are benign.  His immunizations were updated. I discussed the fluttering sensation with him and encouraged him to check his pulse in terms of regular versus irregular as well as the rate and if there is any questions, let me know.  Explained that the fluttering does not necessarily mean this is from cardiac sources.

## 2017-12-20 LAB — COMPREHENSIVE METABOLIC PANEL
A/G RATIO: 1.3 (ref 1.2–2.2)
ALBUMIN: 4.3 g/dL (ref 3.5–5.5)
ALT: 22 IU/L (ref 0–44)
AST: 21 IU/L (ref 0–40)
Alkaline Phosphatase: 103 IU/L (ref 39–117)
BILIRUBIN TOTAL: 0.6 mg/dL (ref 0.0–1.2)
BUN/Creatinine Ratio: 18 (ref 9–20)
BUN: 15 mg/dL (ref 6–24)
CO2: 25 mmol/L (ref 20–29)
Calcium: 9.6 mg/dL (ref 8.7–10.2)
Chloride: 101 mmol/L (ref 96–106)
Creatinine, Ser: 0.85 mg/dL (ref 0.76–1.27)
GFR, EST AFRICAN AMERICAN: 119 mL/min/{1.73_m2} (ref >59)
GFR, EST NON AFRICAN AMERICAN: 103 mL/min/{1.73_m2} (ref >59)
GLOBULIN, TOTAL: 3.2 g/dL (ref 1.5–4.5)
Glucose: 92 mg/dL (ref 65–99)
POTASSIUM: 4.6 mmol/L (ref 3.5–5.2)
Sodium: 140 mmol/L (ref 134–144)
Total Protein: 7.5 g/dL (ref 6.0–8.5)

## 2017-12-20 LAB — LIPID PANEL
CHOL/HDL RATIO: 3.6 ratio (ref 0.0–5.0)
Cholesterol, Total: 141 mg/dL (ref 100–199)
HDL: 39 mg/dL — AB (ref >39)
LDL Calculated: 90 mg/dL (ref 0–99)
Triglycerides: 61 mg/dL (ref 0–149)
VLDL CHOLESTEROL CAL: 12 mg/dL (ref 5–40)

## 2017-12-20 LAB — CBC WITH DIFFERENTIAL/PLATELET
Basophils Absolute: 0.1 10*3/uL (ref 0.0–0.2)
Basos: 1 %
EOS (ABSOLUTE): 0.1 10*3/uL (ref 0.0–0.4)
EOS: 2 %
HEMATOCRIT: 45.5 % (ref 37.5–51.0)
HEMOGLOBIN: 15.7 g/dL (ref 13.0–17.7)
Immature Grans (Abs): 0 10*3/uL (ref 0.0–0.1)
Immature Granulocytes: 0 %
Lymphocytes Absolute: 2.3 10*3/uL (ref 0.7–3.1)
Lymphs: 33 %
MCH: 30.5 pg (ref 26.6–33.0)
MCHC: 34.5 g/dL (ref 31.5–35.7)
MCV: 88 fL (ref 79–97)
MONOS ABS: 0.6 10*3/uL (ref 0.1–0.9)
Monocytes: 8 %
NEUTROS ABS: 4 10*3/uL (ref 1.4–7.0)
Neutrophils: 56 %
Platelets: 313 10*3/uL (ref 150–450)
RBC: 5.15 x10E6/uL (ref 4.14–5.80)
RDW: 13.2 % (ref 12.3–15.4)
WBC: 7.2 10*3/uL (ref 3.4–10.8)

## 2018-01-02 ENCOUNTER — Other Ambulatory Visit (INDEPENDENT_AMBULATORY_CARE_PROVIDER_SITE_OTHER): Payer: BLUE CROSS/BLUE SHIELD

## 2018-01-02 DIAGNOSIS — Z0000 Encounter for general adult medical examination without abnormal findings: Secondary | ICD-10-CM

## 2018-01-02 DIAGNOSIS — Z1211 Encounter for screening for malignant neoplasm of colon: Secondary | ICD-10-CM | POA: Diagnosis not present

## 2018-01-02 LAB — HEMOCCULT GUIAC POC 1CARD (OFFICE)
CARD #3 DATE: 7132019
Card #2 Date: 7122019
Card #3 Fecal Occult Blood, POC: NEGATIVE
FECAL OCCULT BLD: NEGATIVE
Fecal Occult Blood, POC: NEGATIVE
OCCULT BLOOD DATE: 7072019

## 2018-01-02 NOTE — Addendum Note (Signed)
Addended by: Gwinda Maine on: 01/02/2018 04:33 PM   Modules accepted: Orders

## 2018-08-15 ENCOUNTER — Ambulatory Visit (INDEPENDENT_AMBULATORY_CARE_PROVIDER_SITE_OTHER): Payer: Managed Care, Other (non HMO) | Admitting: Family Medicine

## 2018-08-15 VITALS — BP 108/76 | HR 82 | Temp 98.2°F | Wt 205.0 lb

## 2018-08-15 DIAGNOSIS — N50811 Right testicular pain: Secondary | ICD-10-CM | POA: Diagnosis not present

## 2018-08-15 DIAGNOSIS — E010 Iodine-deficiency related diffuse (endemic) goiter: Secondary | ICD-10-CM

## 2018-08-15 NOTE — Progress Notes (Signed)
   Subjective:    Patient ID: Tyrone Graham, male    DOB: 11/11/1970, 48 y.o.   MRN: [id number]  HPI 4 days ago he noted the onset of acute right testicular pain but no discharge, dysuria, abdominal or back pain.  No fever or chills.  The pain is now gone. He also states that his dentist thought that his thyroid gland was enlarged.  Review of Systems     Objective:   Physical Exam Alert and in no distress.  DTRs are normal.  Skin is normal in texture and appearance.  Exam of the neck does show the thyroid to be diffusely enlarged by palpation testicular exam shows penis and testes to be normal.  Palpation of the epididymis causes no pain but he stated that was the area where he noted the discomfort.       Assessment & Plan:  Thyromegaly - Plan: Thyroid Panel With TSH, US Soft Tissue Head/Neck  Testicular pain, right I explained that since he is no longer having testicular pain, no further intervention necessary.  Explained that it was probably epididymitis but the exact cause is unknown.

## 2018-08-16 ENCOUNTER — Encounter: Payer: Self-pay | Admitting: Family Medicine

## 2018-08-16 LAB — THYROID PANEL WITH TSH
Free Thyroxine Index: 1.6 (ref 1.2–4.9)
T3 Uptake Ratio: 26 % (ref 24–39)
T4, Total: 6.2 ug/dL (ref 4.5–12.0)
TSH: 1.98 u[IU]/mL (ref 0.450–4.500)

## 2018-08-20 ENCOUNTER — Telehealth: Payer: Self-pay

## 2018-08-20 NOTE — Telephone Encounter (Signed)
Patient called and stated he still has not received a call about scheduling his Ultrasound. He wants to know what the status is and if he needs to call them to schedule. Please advise.

## 2018-08-20 NOTE — Telephone Encounter (Signed)
Appt set up and pt advised . [place]

## 2018-08-20 NOTE — Telephone Encounter (Signed)
Kim please review previous message as it was sent to Dr. Redmond School by mistake.

## 2018-08-20 NOTE — Telephone Encounter (Signed)
He is supposed to have a thyroid ultrasound.  What is the hold-up?

## 2018-08-23 ENCOUNTER — Ambulatory Visit
Admission: RE | Admit: 2018-08-23 | Discharge: 2018-08-23 | Disposition: A | Discharge status: Home / Self Care | Payer: 59 | Source: Ambulatory Visit | Attending: Family Medicine | Admitting: Family Medicine

## 2018-08-24 ENCOUNTER — Encounter: Payer: Self-pay | Admitting: Family Medicine

## 2018-12-28 ENCOUNTER — Other Ambulatory Visit: Payer: Self-pay

## 2018-12-28 ENCOUNTER — Encounter: Payer: Self-pay | Admitting: Family Medicine

## 2018-12-28 ENCOUNTER — Ambulatory Visit (INDEPENDENT_AMBULATORY_CARE_PROVIDER_SITE_OTHER): Payer: Managed Care, Other (non HMO) | Admitting: Family Medicine

## 2018-12-28 VITALS — BP 120/78 | HR 74 | Temp 97.9°F | Ht 75.0 in | Wt 203.4 lb

## 2018-12-28 DIAGNOSIS — E739 Lactose intolerance, unspecified: Secondary | ICD-10-CM

## 2018-12-28 DIAGNOSIS — I781 Nevus, non-neoplastic: Secondary | ICD-10-CM

## 2018-12-28 DIAGNOSIS — Z0000 Encounter for general adult medical examination without abnormal findings: Secondary | ICD-10-CM

## 2018-12-28 DIAGNOSIS — L309 Dermatitis, unspecified: Secondary | ICD-10-CM | POA: Diagnosis not present

## 2018-12-28 DIAGNOSIS — D1801 Hemangioma of skin and subcutaneous tissue: Secondary | ICD-10-CM

## 2018-12-28 NOTE — Progress Notes (Signed)
   Subjective:    Patient ID: Tyrone Graham, male    DOB: 1971/05/30, 48 y.o.   MRN: [id number]  HPI He is here for complete examination.  He has been playing a lot of tennis lately and has noted some intermittent difficulty with wrist and elbow discomfort.  He does have a previous history of wrist fracture and thinks that it could possibly be causing some difficulty.  He also notes that milk and milk products are now starting to cause some difficulty with bloating and gas.  He does occasionally have difficulty with eczema especially on the face.  Otherwise he has no particular concerns or complaints.  Family and social history as well as health maintenance and immunizations was reviewed.   Review of Systems  All other systems reviewed and are negative.      Objective:   Physical Exam BP 120/78 (BP Location: Left Arm, Patient Position: Sitting)   Pulse 74   Temp 97.9 F (36.6 C)   Ht 6\' 3"  (1.905 m)   Wt 203 lb 6.4 oz (92.3 kg)   SpO2 95%   BMI 25.42 kg/m   General Appearance:    Alert, cooperative, no distress, appears stated age  Head:    Normocephalic, without obvious abnormality, atraumatic  Eyes:    PERRL, conjunctiva/corneas clear, EOM's intact, fundi    benign  Ears:    Normal TM's and external ear canals  Nose:   Nares normal, mucosa normal, no drainage or sinus   tenderness  Throat:   Lips, mucosa, and tongue normal; teeth and gums normal  Neck:   Supple, no lymphadenopathy;  thyroid:  no   enlargement/tenderness/nodules; no carotid   bruit or JVD  Back:    Spine nontender, no curvature, ROM normal, no CVA     tenderness  Lungs:     Clear to auscultation bilaterally without wheezes, rales or     ronchi; respirations unlabored      Heart:    Regular rate and rhythm, S1 and S2 normal, no murmur, rub   or gallop     Abdomen:     Soft, non-tender, nondistended, normoactive bowel sounds,    no masses, no hepatosplenomegaly  Genitalia:    Normal male external  genitalia without lesions.  Testicles without masses.  No inguinal hernias.  Rectal:   Deferred.  Extremities:   No clubbing, cyanosis or edema  Pulses:   2+ and symmetric all extremities  Skin:   Skin color, texture, turgor normal, no rashes or lesions.  Cherry angiomas noted.  Lymph nodes:   Cervical, supraclavicular, and axillary nodes normal  Neurologic:   CNII-XII intact, normal strength, sensation and gait; reflexes 2+ and symmetric throughout          Psych:   Normal mood, affect, hygiene and grooming.          Assessment & Plan:  Routine general medical examination at a health care facility - Plan: CBC with Differential/Platelet, Comprehensive metabolic panel, Lipid panel,   Cherry angioma - Plan: Reassured him that this is nothing to be concerned about  Eczema, unspecified type - Plan: Discussed possibly referring to dermatology but he would hold off on that.  Lactose intolerance - Plan: Recommend Lactaid.

## 2018-12-29 LAB — CBC WITH DIFFERENTIAL/PLATELET
Basophils Absolute: 0.1 10*3/uL (ref 0.0–0.2)
Basos: 1 %
EOS (ABSOLUTE): 0.1 10*3/uL (ref 0.0–0.4)
Eos: 2 %
Hematocrit: 49.8 % (ref 37.5–51.0)
Hemoglobin: 16.9 g/dL (ref 13.0–17.7)
Immature Grans (Abs): 0 10*3/uL (ref 0.0–0.1)
Immature Granulocytes: 0 %
Lymphocytes Absolute: 2.3 10*3/uL (ref 0.7–3.1)
Lymphs: 42 %
MCH: 30.5 pg (ref 26.6–33.0)
MCHC: 33.9 g/dL (ref 31.5–35.7)
MCV: 90 fL (ref 79–97)
Monocytes Absolute: 0.5 10*3/uL (ref 0.1–0.9)
Monocytes: 10 %
Neutrophils Absolute: 2.5 10*3/uL (ref 1.4–7.0)
Neutrophils: 45 %
Platelets: 274 10*3/uL (ref 150–450)
RBC: 5.54 x10E6/uL (ref 4.14–5.80)
RDW: 12.5 % (ref 11.6–15.4)
WBC: 5.5 10*3/uL (ref 3.4–10.8)

## 2018-12-29 LAB — COMPREHENSIVE METABOLIC PANEL
ALT: 27 IU/L (ref 0–44)
AST: 24 IU/L (ref 0–40)
Albumin/Globulin Ratio: 1.4 (ref 1.2–2.2)
Albumin: 4.5 g/dL (ref 4.0–5.0)
Alkaline Phosphatase: 97 IU/L (ref 39–117)
BUN/Creatinine Ratio: 13 (ref 9–20)
BUN: 12 mg/dL (ref 6–24)
Bilirubin Total: 0.8 mg/dL (ref 0.0–1.2)
CO2: 23 mmol/L (ref 20–29)
Calcium: 9.6 mg/dL (ref 8.7–10.2)
Chloride: 100 mmol/L (ref 96–106)
Creatinine, Ser: 0.91 mg/dL (ref 0.76–1.27)
GFR calc Af Amer: 114 mL/min/{1.73_m2} (ref >59)
GFR calc non Af Amer: 99 mL/min/{1.73_m2} (ref >59)
Globulin, Total: 3.2 g/dL (ref 1.5–4.5)
Glucose: 89 mg/dL (ref 65–99)
Potassium: 4.5 mmol/L (ref 3.5–5.2)
Sodium: 142 mmol/L (ref 134–144)
Total Protein: 7.7 g/dL (ref 6.0–8.5)

## 2018-12-29 LAB — LIPID PANEL
Chol/HDL Ratio: 3.2 ratio (ref 0.0–5.0)
Cholesterol, Total: 137 mg/dL (ref 100–199)
HDL: 43 mg/dL (ref >39)
LDL Calculated: 80 mg/dL (ref 0–99)
Triglycerides: 70 mg/dL (ref 0–149)
VLDL Cholesterol Cal: 14 mg/dL (ref 5–40)

## 2019-08-29 ENCOUNTER — Ambulatory Visit (INDEPENDENT_AMBULATORY_CARE_PROVIDER_SITE_OTHER): Payer: Managed Care, Other (non HMO) | Admitting: Family Medicine

## 2019-08-29 ENCOUNTER — Other Ambulatory Visit: Payer: Self-pay

## 2019-08-29 ENCOUNTER — Encounter: Payer: Self-pay | Admitting: Family Medicine

## 2019-08-29 VITALS — BP 124/76 | HR 71 | Temp 97.7°F | Wt 204.2 lb

## 2019-08-29 DIAGNOSIS — R21 Rash and other nonspecific skin eruption: Secondary | ICD-10-CM | POA: Diagnosis not present

## 2019-08-29 NOTE — Progress Notes (Signed)
   Subjective:    Patient ID: Tyrone Graham, male    DOB: [birth year], 49 y.o.   MRN: [id number]  HPI He is here for evaluation of rash on both arms.  This occurred twice and both times it occurred when he is outside doing yard work.  It is in a bilateral pattern on his forearms, erythematous and pruritic but then went away fairly quickly.  He noted it nowhere else on his body.   Review of Systems     Objective:   Physical Exam Alert and in no distress.  Exam of both forearms shows no visible lesions.      Assessment & Plan:  Rash I explained that it is hard to say exactly what is causing this rash and he is to pay attention to any exposure to soaps detergents, chemicals especially if he is outside.  Also recommend he take a picture of the rash since it does tend to go away fairly quickly.

## 2019-08-30 ENCOUNTER — Encounter: Payer: Self-pay | Admitting: Family Medicine

## 2019-09-02 ENCOUNTER — Encounter: Payer: Self-pay | Admitting: Family Medicine

## 2019-09-14 ENCOUNTER — Encounter: Payer: Self-pay | Admitting: Family Medicine

## 2019-10-28 ENCOUNTER — Ambulatory Visit (INDEPENDENT_AMBULATORY_CARE_PROVIDER_SITE_OTHER): Payer: Managed Care, Other (non HMO) | Admitting: Family Medicine

## 2019-10-28 ENCOUNTER — Other Ambulatory Visit: Payer: Self-pay

## 2019-10-28 VITALS — BP 102/68 | HR 83 | Temp 98.4°F | Wt 201.0 lb

## 2019-10-28 DIAGNOSIS — M25561 Pain in right knee: Secondary | ICD-10-CM

## 2019-10-28 NOTE — Progress Notes (Signed)
   Subjective:    Patient ID: Tyrone Graham, male    DOB: 06-Jan-1971, 49 y.o.   MRN: [id number]  HPI Why you doing that I will dictate so normally he is here for evaluation of right knee pain.  He states it started Friday.  He has no history of injury or overuse.  He states that when he twists or rotates his right knee, he will have pain.  He points to the prepatellar area.  No popping, locking or grinding.  He is not taking any medications.   Review of Systems     Objective:   Physical Exam Alert and in no distress.  Exam of the right knee shows no swelling, tenderness to palpation.  Medial and collateral ligaments intact.  Anterior drawer negative.  McMurray's testing negative.       Assessment & Plan:  Acute pain of right knee I explained that at this time there is no particular concerns other than to take 2 Aleve twice per day.  Discussed the sequence of events that would occur if he continues have trouble including x-rays and possible injection.  Explained that this could easily be cartilage but at this point physical therapy if we got aggressive would be the main thing to do.  He was comfortable with that.

## 2019-12-30 ENCOUNTER — Encounter: Payer: Self-pay | Admitting: Family Medicine

## 2020-01-29 ENCOUNTER — Other Ambulatory Visit: Payer: Self-pay

## 2020-01-29 ENCOUNTER — Ambulatory Visit (INDEPENDENT_AMBULATORY_CARE_PROVIDER_SITE_OTHER): Payer: Managed Care, Other (non HMO) | Admitting: Family Medicine

## 2020-01-29 ENCOUNTER — Encounter: Payer: Self-pay | Admitting: Family Medicine

## 2020-01-29 VITALS — BP 100/66 | HR 69 | Temp 98.0°F | Ht 74.5 in | Wt 202.4 lb

## 2020-01-29 DIAGNOSIS — E739 Lactose intolerance, unspecified: Secondary | ICD-10-CM | POA: Diagnosis not present

## 2020-01-29 DIAGNOSIS — Z1159 Encounter for screening for other viral diseases: Secondary | ICD-10-CM | POA: Diagnosis not present

## 2020-01-29 DIAGNOSIS — Z1211 Encounter for screening for malignant neoplasm of colon: Secondary | ICD-10-CM

## 2020-01-29 DIAGNOSIS — Z0000 Encounter for general adult medical examination without abnormal findings: Secondary | ICD-10-CM

## 2020-01-29 DIAGNOSIS — F325 Major depressive disorder, single episode, in full remission: Secondary | ICD-10-CM

## 2020-01-29 NOTE — Progress Notes (Signed)
   Subjective:    Patient ID: Tyrone Graham, male    DOB: Jun 26, 1970, 49 y.o.   MRN: [id number]  HPI He is here for complete examination.  He has had some difficulty with left knee pain especially when he gets up in the morning and with extension however he is able to function the rest the day including playing tennis without any difficulty.  He also has noted some left foot discomfort but this tends to go away when he wears his shoes.  He does have a history of lactose intolerance and does a good job avoiding things he knows will cause trouble.  There is also a previous history of depression but at this point he is back to being his normal self.  His work and home life seem to be going well.  He does have a senior in high school and is senior in college.  He has had his Covid vaccine.  Family and social history as well as health maintenance and immunizations was reviewed. He takes OTC medications.  Review of Systems  All other systems reviewed and are negative.      Objective:   Physical Exam Alert and in no distress. Tympanic membranes and canals are normal. Pharyngeal area is normal. Neck is supple without adenopathy or thyromegaly. Cardiac exam shows a regular sinus rhythm without murmurs or gallops. Lungs are clear to auscultation. Abdominal exam shows no masses or tenderness with normal bowel sounds.       Assessment & Plan:  Routine general medical examination at a health care facility - Plan: CBC with Differential/Platelet, Comprehensive metabolic panel, Lipid panel  Lactose intolerance  Encounter for hepatitis C screening test for low risk patient - Plan: Hepatitis C antibody  Screening for colon cancer - Plan: Cologuard  Depression, major, in remission ([place]) Encouraged him to continue to take good care of himself.  Explained that the knee and foot problems obviously are of no great concern or he would have had more difficulty at this point.  He was comfortable with  that.

## 2020-01-30 LAB — COMPREHENSIVE METABOLIC PANEL
ALT: 22 IU/L (ref 0–44)
AST: 20 IU/L (ref 0–40)
Albumin/Globulin Ratio: 1.4 (ref 1.2–2.2)
Albumin: 4.6 g/dL (ref 4.0–5.0)
Alkaline Phosphatase: 96 IU/L (ref 48–121)
BUN/Creatinine Ratio: 16 (ref 9–20)
BUN: 14 mg/dL (ref 6–24)
Bilirubin Total: 1 mg/dL (ref 0.0–1.2)
CO2: 24 mmol/L (ref 20–29)
Calcium: 9.8 mg/dL (ref 8.7–10.2)
Chloride: 99 mmol/L (ref 96–106)
Creatinine, Ser: 0.86 mg/dL (ref 0.76–1.27)
GFR calc Af Amer: 117 mL/min/{1.73_m2} (ref >59)
GFR calc non Af Amer: 101 mL/min/{1.73_m2} (ref >59)
Globulin, Total: 3.4 g/dL (ref 1.5–4.5)
Glucose: 93 mg/dL (ref 65–99)
Potassium: 4.3 mmol/L (ref 3.5–5.2)
Sodium: 137 mmol/L (ref 134–144)
Total Protein: 8 g/dL (ref 6.0–8.5)

## 2020-01-30 LAB — LIPID PANEL
Chol/HDL Ratio: 3.5 ratio (ref 0.0–5.0)
Cholesterol, Total: 149 mg/dL (ref 100–199)
HDL: 43 mg/dL (ref >39)
LDL Chol Calc (NIH): 93 mg/dL (ref 0–99)
Triglycerides: 62 mg/dL (ref 0–149)
VLDL Cholesterol Cal: 13 mg/dL (ref 5–40)

## 2020-01-30 LAB — CBC WITH DIFFERENTIAL/PLATELET
Basophils Absolute: 0.1 10*3/uL (ref 0.0–0.2)
Basos: 1 %
EOS (ABSOLUTE): 0.1 10*3/uL (ref 0.0–0.4)
Eos: 1 %
Hematocrit: 48.2 % (ref 37.5–51.0)
Hemoglobin: 17.1 g/dL (ref 13.0–17.7)
Immature Grans (Abs): 0 10*3/uL (ref 0.0–0.1)
Immature Granulocytes: 0 %
Lymphocytes Absolute: 3.2 10*3/uL — ABNORMAL HIGH (ref 0.7–3.1)
Lymphs: 37 %
MCH: 31.9 pg (ref 26.6–33.0)
MCHC: 35.5 g/dL (ref 31.5–35.7)
MCV: 90 fL (ref 79–97)
Monocytes Absolute: 0.7 10*3/uL (ref 0.1–0.9)
Monocytes: 8 %
Neutrophils Absolute: 4.5 10*3/uL (ref 1.4–7.0)
Neutrophils: 53 %
Platelets: 293 10*3/uL (ref 150–450)
RBC: 5.36 x10E6/uL (ref 4.14–5.80)
RDW: 12.8 % (ref 11.6–15.4)
WBC: 8.6 10*3/uL (ref 3.4–10.8)

## 2020-01-30 LAB — HEPATITIS C ANTIBODY: Hep C Virus Ab: <0.1 s/co ratio (ref 0.0–0.9)

## 2020-02-11 LAB — COLOGUARD: Cologuard: NEGATIVE

## 2020-02-15 ENCOUNTER — Encounter: Payer: Self-pay | Admitting: Family Medicine

## 2020-02-18 LAB — COLOGUARD

## 2020-02-28 ENCOUNTER — Telehealth: Payer: Self-pay

## 2020-02-28 NOTE — Telephone Encounter (Signed)
PT WAS ADVISED OF NEGATIVE COLOGUARD RESULTS. kH

## 2020-03-11 ENCOUNTER — Encounter: Payer: Self-pay | Admitting: Family Medicine

## 2021-01-26 IMAGING — US US THYROID
1 series · 14 of 25 positions shown · non-contrast
Comparison: None.

CLINICAL DATA: Palpable abnormality.  Thyromegaly on physical exam.

EXAM:
THYROID ULTRASOUND
TECHNIQUE: Ultrasound examination of the thyroid gland and adjacent soft
tissues was performed.

[Series 1: us thyroid · 0.05mm/px · 14 of 44 slices shown]
[im 1/44]
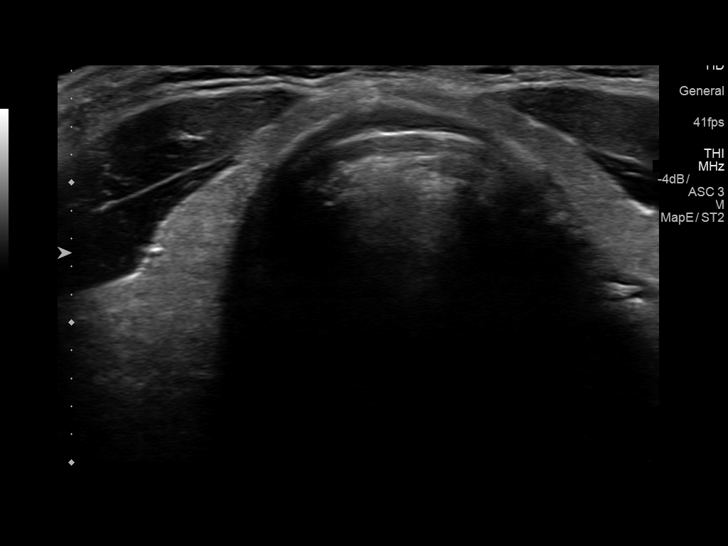
[im 4/44]
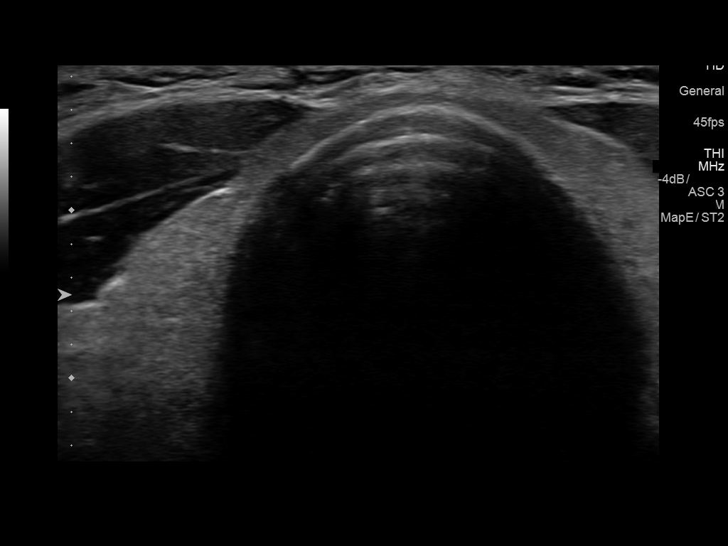
[im 8/44]
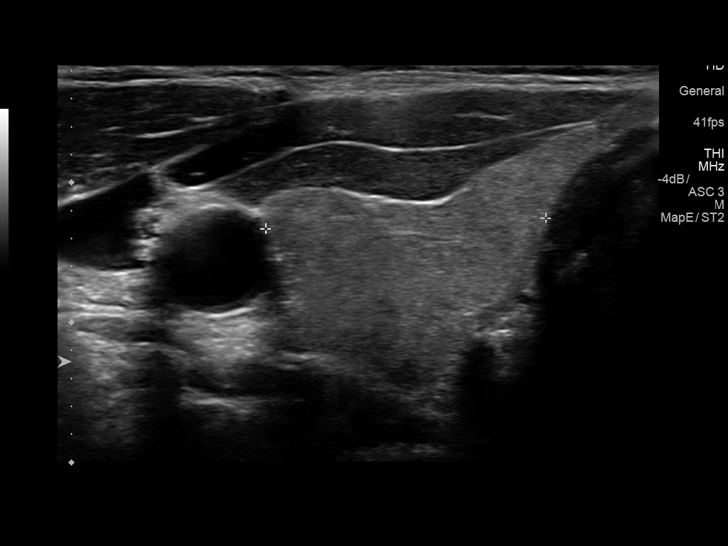
[im 11/44]
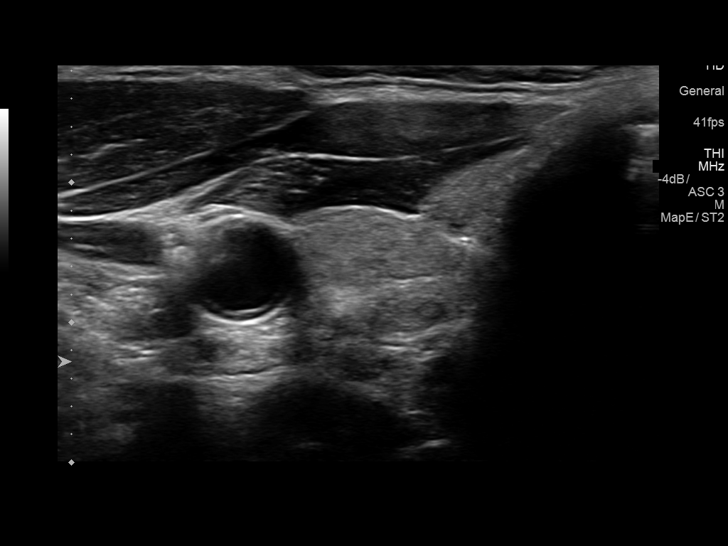
[im 15/44]
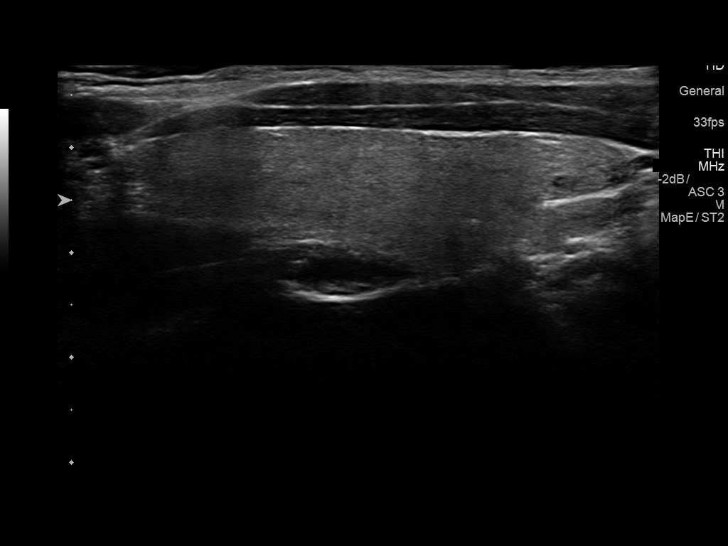
[im 17/44]
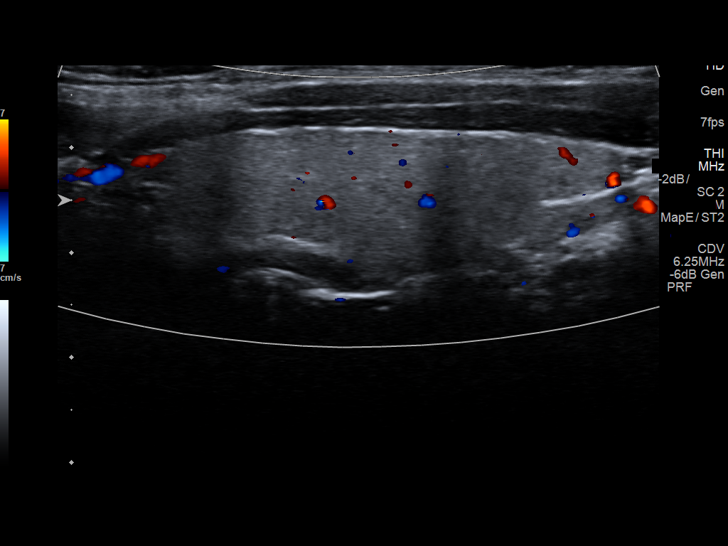
[im 20/44]
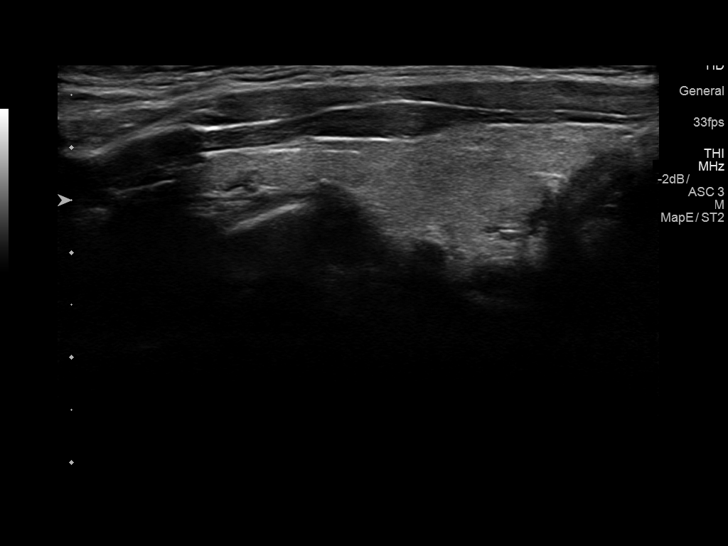
[im 24/44]
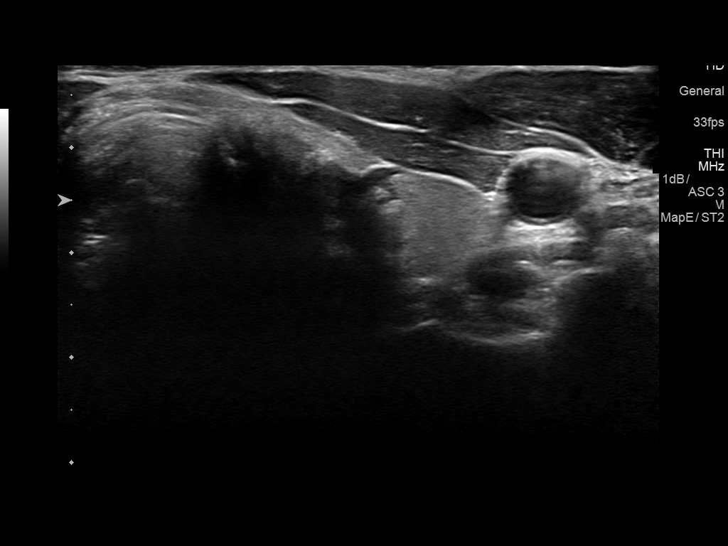
[im 27/44]
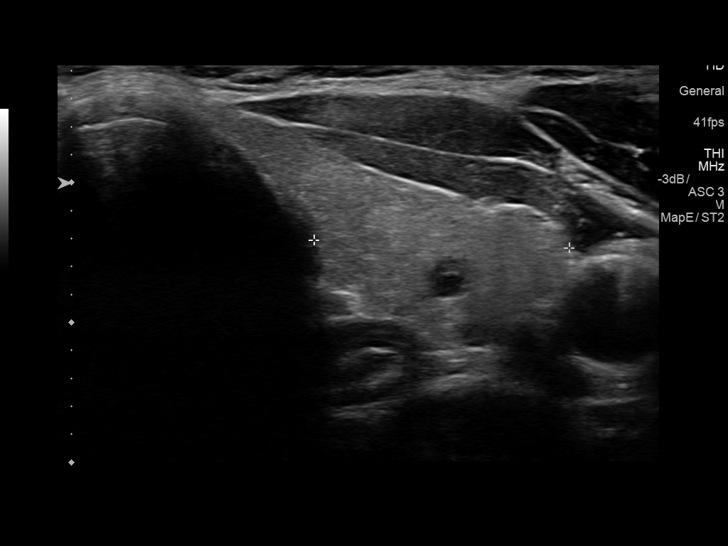
[im 29/44]
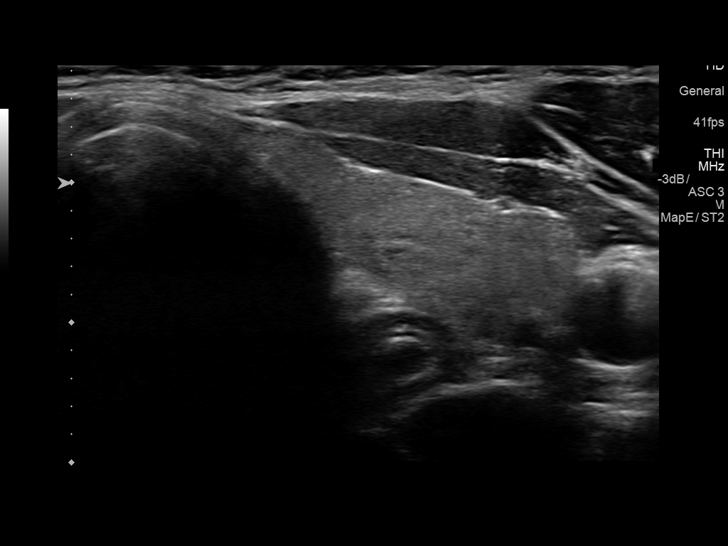
[im 33/44]
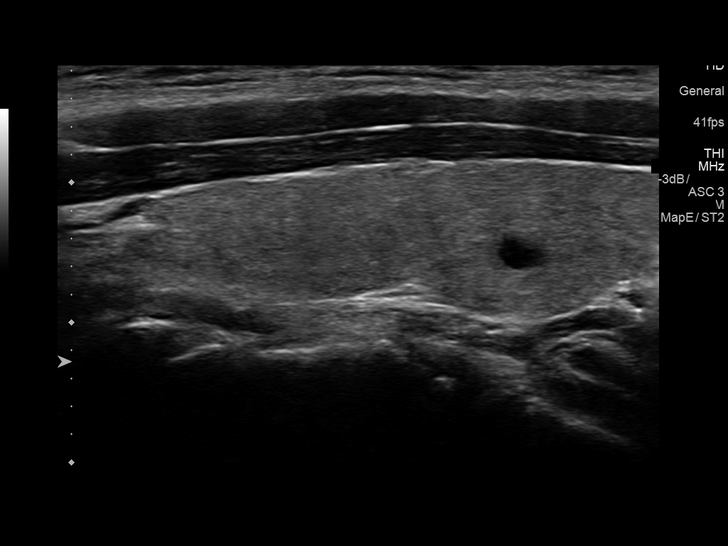
[im 36/44]
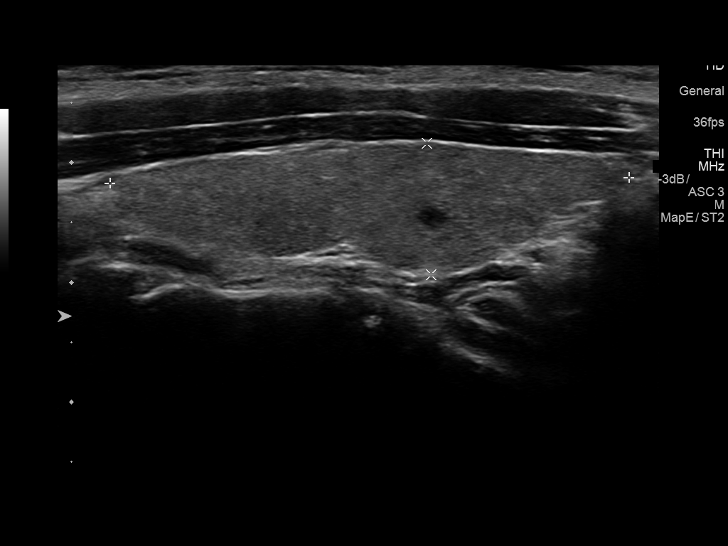
[im 40/44]
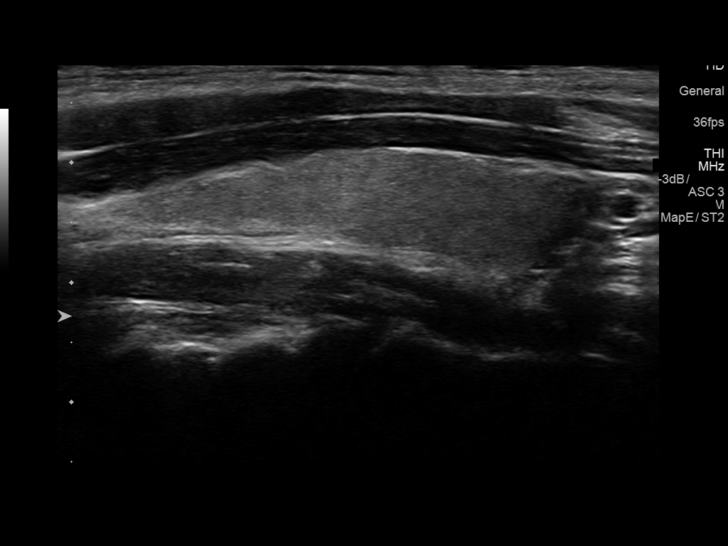
[im 44/44]
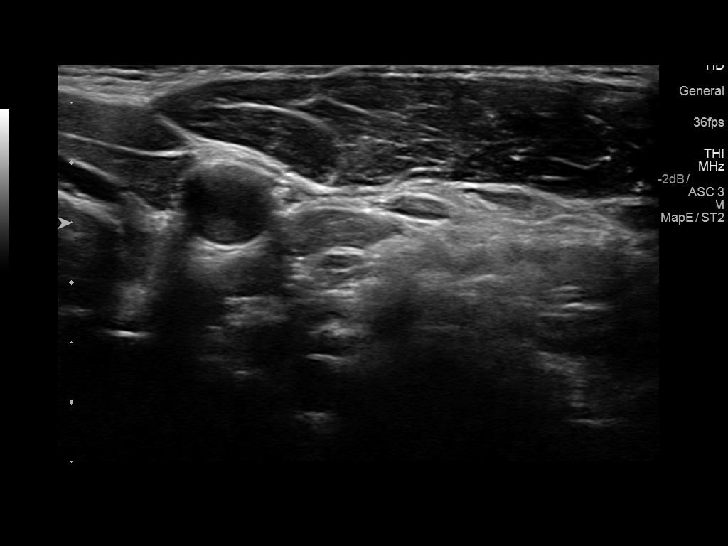

[14 of 25 positions shown; findings below may reference images not displayed]

FINDINGS: Parenchymal Echotexture: Mildly heterogenous

Isthmus: 0.1 cm

Right lobe: 5.0 x 1.4 x 2.0 cm

Left lobe: 4.3 x 1.1 x 1.8 cm

_________________________________________________________

Estimated total number of nodules >/= 1 cm: 0

Number of spongiform nodules >/=  2 cm not described below (TR1): 0

Number of mixed cystic and solid nodules >/= 1.5 cm not described
below (TR2): 0

_________________________________________________________

0.4 cm cystic nodule in the left lower pole does not meet criteria
for biopsy nor follow-up.
IMPRESSION: There are no nodules which meet criteria for biopsy nor follow-up.
The gland is within normal limits in size.

The above is in keeping with the ACR TI-RADS recommendations - [HOSPITAL] 7113;[DATE].

## 2021-02-02 ENCOUNTER — Ambulatory Visit (INDEPENDENT_AMBULATORY_CARE_PROVIDER_SITE_OTHER): Payer: Managed Care, Other (non HMO) | Admitting: Family Medicine

## 2021-02-02 ENCOUNTER — Other Ambulatory Visit: Payer: Self-pay

## 2021-02-02 ENCOUNTER — Encounter: Payer: Self-pay | Admitting: Family Medicine

## 2021-02-02 VITALS — BP 100/68 | HR 72 | Temp 96.7°F | Ht 75.0 in | Wt 203.8 lb

## 2021-02-02 DIAGNOSIS — Z638 Other specified problems related to primary support group: Secondary | ICD-10-CM | POA: Diagnosis not present

## 2021-02-02 DIAGNOSIS — F325 Major depressive disorder, single episode, in full remission: Secondary | ICD-10-CM

## 2021-02-02 DIAGNOSIS — Z23 Encounter for immunization: Secondary | ICD-10-CM

## 2021-02-02 DIAGNOSIS — L989 Disorder of the skin and subcutaneous tissue, unspecified: Secondary | ICD-10-CM | POA: Diagnosis not present

## 2021-02-02 DIAGNOSIS — Z0000 Encounter for general adult medical examination without abnormal findings: Secondary | ICD-10-CM

## 2021-02-02 NOTE — Progress Notes (Signed)
   Subjective:    Patient ID: Tyrone Graham, male    DOB: 11-Jul-1970, 50 y.o.   MRN: [id number]  HPI He is here for complete examination he states that the lesion on his right foot does intermittently bother him and he thinks it is growing.  He also states that occasionally he will have fleeting pain in the left chest and in the left groin area that lasts less than a second goes away.  He cannot relate this to anything in particular.  There is also some's family stress going on with his oldest daughter now back at home who apparently has anxiety issues that have not been fully addressed.  The parents have pushed her to get counseling which apparently she is down but they are still issues with her finding a job, moving out of the house.  She does not drive because of these issues.  Otherwise he has no particular concerns or complaints.  Family and social history as well as health maintenance and immunizations was reviewed.   Review of Systems  All other systems reviewed and are negative.     Objective:   Physical Exam Alert and in no distress. Tympanic membranes and canals are normal. Pharyngeal area is normal. Neck is supple without adenopathy or thyromegaly. Cardiac exam shows a regular sinus rhythm without murmurs or gallops. Lungs are clear to auscultation. Abdominal exam shows no masses or tenderness with normal bowel sounds Exam of the right foot does show a relatively firm round slightly movable lesion over the dorsum of the foot medially. PHQ score of 5      Assessment & Plan:  Routine general medical examination at a health care facility - Plan: CBC with Differential/Platelet, Comprehensive metabolic panel, Lipid panel  Depression, major, in remission ([place])  Immunization, viral disease - Plan: PFIZER Comirnaty(GRAY TOP)COVID-19 Vaccine  Foot lesion - Plan: Ambulatory referral to Podiatry  Stress due to family tension Discussed the family situation with him and encouraged  him and his wife to get involved in counseling to find out what they can and cannot do to help their daughter.  I expressed my concern that I did not want him to become a neighbors to interfere with her moving on with her life because of her stress and anxiety issues.

## 2021-02-03 LAB — COMPREHENSIVE METABOLIC PANEL
ALT: 24 IU/L (ref 0–44)
AST: 21 IU/L (ref 0–40)
Albumin/Globulin Ratio: 1.5 (ref 1.2–2.2)
Albumin: 4.5 g/dL (ref 3.8–4.9)
Alkaline Phosphatase: 88 IU/L (ref 44–121)
BUN/Creatinine Ratio: 13 (ref 9–20)
BUN: 12 mg/dL (ref 6–24)
Bilirubin Total: 0.8 mg/dL (ref 0.0–1.2)
CO2: 23 mmol/L (ref 20–29)
Calcium: 9.7 mg/dL (ref 8.7–10.2)
Chloride: 102 mmol/L (ref 96–106)
Creatinine, Ser: 0.96 mg/dL (ref 0.76–1.27)
Globulin, Total: 3.1 g/dL (ref 1.5–4.5)
Glucose: 95 mg/dL (ref 65–99)
Potassium: 4.8 mmol/L (ref 3.5–5.2)
Sodium: 140 mmol/L (ref 134–144)
Total Protein: 7.6 g/dL (ref 6.0–8.5)
eGFR: 96 mL/min/{1.73_m2} (ref >59)

## 2021-02-03 LAB — CBC WITH DIFFERENTIAL/PLATELET
Basophils Absolute: 0.1 10*3/uL (ref 0.0–0.2)
Basos: 1 %
EOS (ABSOLUTE): 0.1 10*3/uL (ref 0.0–0.4)
Eos: 2 %
Hematocrit: 48.5 % (ref 37.5–51.0)
Hemoglobin: 17 g/dL (ref 13.0–17.7)
Immature Grans (Abs): 0 10*3/uL (ref 0.0–0.1)
Immature Granulocytes: 0 %
Lymphocytes Absolute: 2.5 10*3/uL (ref 0.7–3.1)
Lymphs: 38 %
MCH: 31.1 pg (ref 26.6–33.0)
MCHC: 35.1 g/dL (ref 31.5–35.7)
MCV: 89 fL (ref 79–97)
Monocytes Absolute: 0.6 10*3/uL (ref 0.1–0.9)
Monocytes: 9 %
Neutrophils Absolute: 3.3 10*3/uL (ref 1.4–7.0)
Neutrophils: 50 %
Platelets: 248 10*3/uL (ref 150–450)
RBC: 5.46 x10E6/uL (ref 4.14–5.80)
RDW: 12.8 % (ref 11.6–15.4)
WBC: 6.6 10*3/uL (ref 3.4–10.8)

## 2021-02-03 LAB — LIPID PANEL
Chol/HDL Ratio: 3.9 ratio (ref 0.0–5.0)
Cholesterol, Total: 146 mg/dL (ref 100–199)
HDL: 37 mg/dL — ABNORMAL LOW (ref >39)
LDL Chol Calc (NIH): 87 mg/dL (ref 0–99)
Triglycerides: 122 mg/dL (ref 0–149)
VLDL Cholesterol Cal: 22 mg/dL (ref 5–40)

## 2021-02-11 ENCOUNTER — Other Ambulatory Visit: Payer: Self-pay

## 2021-02-11 ENCOUNTER — Ambulatory Visit (INDEPENDENT_AMBULATORY_CARE_PROVIDER_SITE_OTHER): Payer: Managed Care, Other (non HMO)

## 2021-02-11 ENCOUNTER — Ambulatory Visit (INDEPENDENT_AMBULATORY_CARE_PROVIDER_SITE_OTHER): Payer: Managed Care, Other (non HMO) | Admitting: Podiatry

## 2021-02-11 ENCOUNTER — Encounter: Payer: Self-pay | Admitting: Podiatry

## 2021-02-11 DIAGNOSIS — M67471 Ganglion, right ankle and foot: Secondary | ICD-10-CM

## 2021-02-11 DIAGNOSIS — M6740 Ganglion, unspecified site: Secondary | ICD-10-CM

## 2021-02-11 DIAGNOSIS — D3613 Benign neoplasm of peripheral nerves and autonomic nervous system of lower limb, including hip: Secondary | ICD-10-CM

## 2021-02-12 ENCOUNTER — Other Ambulatory Visit: Payer: Self-pay | Admitting: Podiatry

## 2021-02-12 DIAGNOSIS — M67471 Ganglion, right ankle and foot: Secondary | ICD-10-CM
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
## 2021-04-15 ENCOUNTER — Other Ambulatory Visit: Payer: Self-pay

## 2021-04-15 ENCOUNTER — Encounter: Payer: Self-pay | Admitting: Family Medicine

## 2021-04-15 ENCOUNTER — Ambulatory Visit (INDEPENDENT_AMBULATORY_CARE_PROVIDER_SITE_OTHER): Payer: Managed Care, Other (non HMO) | Admitting: Family Medicine

## 2021-04-15 VITALS — BP 116/84 | HR 78 | Wt 206.2 lb

## 2021-04-15 DIAGNOSIS — R1030 Lower abdominal pain, unspecified: Secondary | ICD-10-CM | POA: Diagnosis not present

## 2021-04-15 LAB — HEMOCCULT GUIAC POC 1CARD (OFFICE): Fecal Occult Blood, POC: NEGATIVE

## 2021-04-15 NOTE — Progress Notes (Signed)
   Subjective:    Patient ID: Tyrone Graham, male    DOB: Mar 30, 1971, 50 y.o.   MRN: [id number]  HPI He complains of a 10-day history of difficulty with intermittent suprapubic discomfort.  No urgency, dysuria or discharge, frequency.  He does state that his bowel habits have changed and become more irregular.  No nausea, vomiting, blood in his stool.  They seem to be smaller and softer than normal.  He did have a Cologuard done in August 2021.   Review of Systems     Objective:   Physical Exam Alert and in no distress.  Abdominal exam shows no masses or tenderness.  Genitalia normal circumcised male penis and testes normal.  Rectal exam showed no stool or masses.  Stool was guaiac negative.       Assessment & Plan:  Lower abdominal pain - Plan: POCT Urinalysis DIP (Proadvantage Device), POCT occult blood stool I explained that at this point there is nothing in particular that I can think that is causing this.  Recommend that he use MiraLAX regularly for the next couple weeks to see if that helps eliminate the problem.  If not he is to pay attention to anything that makes his better or worse and come back in for further evaluation.  Discussed possible x-ray/CT and possible referral depending upon what happens with his symptoms.

## 2021-04-16 LAB — POCT URINALYSIS DIP (PROADVANTAGE DEVICE)
Bilirubin, UA: NEGATIVE
Blood, UA: NEGATIVE
Glucose, UA: NEGATIVE mg/dL
Ketones, POC UA: NEGATIVE mg/dL
Leukocytes, UA: NEGATIVE
Nitrite, UA: NEGATIVE
Protein Ur, POC: NEGATIVE mg/dL
Specific Gravity, Urine: 1.015
Urobilinogen, Ur: 0.2
pH, UA: 6 (ref 5.0–8.0)

## 2021-04-23 ENCOUNTER — Encounter: Payer: Self-pay | Admitting: Family Medicine

## 2021-04-23 DIAGNOSIS — R1030 Lower abdominal pain, unspecified: Secondary | ICD-10-CM

## 2021-05-03 ENCOUNTER — Encounter: Payer: Self-pay | Admitting: Gastroenterology

## 2021-05-03 ENCOUNTER — Ambulatory Visit (INDEPENDENT_AMBULATORY_CARE_PROVIDER_SITE_OTHER): Payer: Managed Care, Other (non HMO) | Admitting: Gastroenterology

## 2021-05-03 VITALS — BP 110/80 | HR 80 | Ht 74.5 in | Wt 208.0 lb

## 2021-05-03 DIAGNOSIS — R1032 Left lower quadrant pain: Secondary | ICD-10-CM

## 2021-05-03 MED ORDER — SUTAB 1479-225-188 MG PO TABS: 1.0000 | ORAL_TABLET | Freq: Once | ORAL | 0 refills | Status: AC

## 2021-05-03 NOTE — Patient Instructions (Signed)
If you are age 51 or older, your body mass index should be between 23-30. Your Body mass index is 26.35 kg/m. If this is out of the aforementioned range listed, please consider follow up with your Primary Care Provider.  If you are age 59 or younger, your body mass index should be between 19-25. Your Body mass index is 26.35 kg/m. If this is out of the aformentioned range listed, please consider follow up with your Primary Care Provider.   You have been scheduled for a colonoscopy. Please follow written instructions given to you at your visit today.  Please pick up your prep supplies at the pharmacy within the next 1-3 days. If you use inhalers (even only as needed), please bring them with you on the day of your procedure.  The [facility] providers would like to encourage you to use [facility] to communicate with providers for non-urgent requests or questions.  Due to long hold times on the telephone, sending your provider a message by [facility] may be a faster and more efficient way to get a response.  Please allow 48 business hours for a response.  Please remember that this is for non-urgent requests.   It was a pleasure to see you today!  Thank you for trusting me with your gastrointestinal care!    Scott E.Candis Schatz, MD

## 2021-05-03 NOTE — Progress Notes (Signed)
HPI : Tyrone Graham is a very pleasant 50 year old male with a history of anxiety and depression who is referred to [place] by Dr. Jill Alexanders for further evaluation of new abdominal pain.  Patient states he started having vague abdominal pain a little over a month ago.  Initially started in the periumbilical region, but has migrated more into the left lower quadrant.  Pain is mild and does not interfere with his daily activities.  It is persistent and noticeable throughout the day.  It is described as a pressure type sensation sometimes with cramping.  Initially, he thought that maybe it was musculoskeletal and that he had overdid it working in the yard, but the pain has not resolved.  He also thought it might be due to constipation, as he often skip a day between bowel movements.  He took MiraLAX daily for a few days, with increase in his stool frequency, but no improvement in his abdominal pain.  He denies any blood in his stool.  No unintentional weight loss.  No nausea or vomiting.  His appetite is good. He reports his stools are usually formed, brown and soft. He has never had a colonoscopy.  He has done regular stool-based screening tests which have been persistently negative.  He had a Cologuard in August 2021.  He had a office-based fecal occult blood test last month which was negative. No family history of colon cancer  Past Medical History:  Diagnosis Date   Anxiety    Appendicitis    Depression    Inguinal hernia      Past Surgical History:  Procedure Laterality Date   APPENDECTOMY     INGUINAL HERNIA REPAIR Left    Family History  Problem Relation Age of Onset   Mental illness Mother    Diabetes Father    Heart disease Father    Heart failure Father    Healthy Daughter    Healthy Daughter    Prostate cancer Paternal Uncle    Prostate cancer Cousin 31   Social History   Tobacco Use   Smoking status: Never   Smokeless tobacco: Never  Vaping Use   Vaping Use: Never  used  Substance Use Topics   Alcohol use: Yes    Comment: 2-3 per week   Drug use: No   Current Outpatient Medications  Medication Sig Dispense Refill   Ascorbic Acid (VITAMIN C WITH ROSE HIPS) 1000 MG tablet Take 1,000 mg by mouth as needed.     CHARCOAL ACTIVATED PO Take 500 mg by mouth.     Flaxseed, Linseed, (EQL FLAXSEED OIL PO) Take 1,400 mg by mouth.     ibuprofen (ADVIL,MOTRIN) 200 MG tablet Take 200 mg by mouth every 6 (six) hours as needed.     Multiple Vitamins-Minerals (MULTIVITAMIN WITH MINERALS) tablet Take 1 tablet by mouth daily.     Nutritional Supplements (VITAMIN D MAINTENANCE PO) Take 50 mcg by mouth.     Probiotic Product (PROBIOTIC DAILY PO) Take by mouth.     PSYLLIUM PO 250 mg.     No current facility-administered medications for this visit.   No Known Allergies   Review of Systems: All systems reviewed and negative except where noted in HPI.    Physical Exam: BP 110/80 (BP Location: Left Arm, Patient Position: Sitting, Cuff Size: Normal)   Pulse 80   Ht 6' 2.5" (1.892 m) Comment: from previous height  Wt 208 lb (94.3 kg)   BMI 26.35 kg/m  Constitutional: Pleasant,well-developed,  Caucasian male in no acute distress. HEENT: Normocephalic and atraumatic. Conjunctivae are normal. No scleral icterus. Cardiovascular: Normal rate, regular rhythm.  Pulmonary/chest: Effort normal and breath sounds normal. No wheezing, rales or rhonchi. Abdominal: Soft, nondistended, nontender.  Abdominal pain not reproducible with palpation. Bowel sounds active throughout. There are no masses palpable. No hepatomegaly.  Carnett's sign negative Extremities: no edema Neurological: Alert and oriented to person place and time. Skin: Skin is warm and dry. No rashes noted. Psychiatric: Normal mood and affect. Behavior is normal.  CBC    Component Value Date/Time   WBC 6.6 02/02/2021 0846   WBC 6.6 03/08/2016 1034   RBC 5.46 02/02/2021 0846   RBC 5.40 03/08/2016 1034   HGB  17.0 02/02/2021 0846   HCT 48.5 02/02/2021 0846   PLT 248 02/02/2021 0846   MCV 89 02/02/2021 0846   MCH 31.1 02/02/2021 0846   MCH 30.6 03/08/2016 1034   MCHC 35.1 02/02/2021 0846   MCHC 35.3 03/08/2016 1034   RDW 12.8 02/02/2021 0846   LYMPHSABS 2.5 02/02/2021 0846   MONOABS 594 03/08/2016 1034   EOSABS 0.1 02/02/2021 0846   BASOSABS 0.1 02/02/2021 0846    CMP     Component Value Date/Time   NA 140 02/02/2021 0846   K 4.8 02/02/2021 0846   CL 102 02/02/2021 0846   CO2 23 02/02/2021 0846   GLUCOSE 95 02/02/2021 0846   GLUCOSE 91 03/08/2016 1034   BUN 12 02/02/2021 0846   CREATININE 0.96 02/02/2021 0846   CREATININE 0.85 03/08/2016 1034   CALCIUM 9.7 02/02/2021 0846   PROT 7.6 02/02/2021 0846   ALBUMIN 4.5 02/02/2021 0846   AST 21 02/02/2021 0846   ALT 24 02/02/2021 0846   ALKPHOS 88 02/02/2021 0846   BILITOT 0.8 02/02/2021 0846   GFRNONAA 101 01/29/2020 1613   GFRAA 117 01/29/2020 1613     ASSESSMENT AND PLAN: 50 year old male with new onset vague abdominal discomfort, suspect musculoskeletal etiology, but not reproducible on exam and Carnett's sign negative.  No significant change in bowel habits.  Although his symptom itself is not concerning, given its new onset and the fact he has never had a colonoscopy, I think it is reasonable to proceed with a colonoscopy to exclude mass lesion.  The patient was in agreement with this plan.  Left lower quadrant abdominal pain - Colonoscopy  The details, risks (including bleeding, perforation, infection, missed lesions, medication reactions and possible hospitalization or surgery if complications occur), benefits, and alternatives to colonoscopy with possible biopsy and possible polypectomy were discussed with the patient and he consents to proceed.   Tyrone Graham E. Candis Schatz, MD [facility]  CC:  Denita Lung, MD

## 2021-05-24 ENCOUNTER — Encounter: Payer: Managed Care, Other (non HMO) | Admitting: Gastroenterology

## 2021-05-25 ENCOUNTER — Encounter: Payer: Self-pay | Admitting: Gastroenterology

## 2021-05-31 ENCOUNTER — Ambulatory Visit (AMBULATORY_SURGERY_CENTER): Payer: Managed Care, Other (non HMO) | Admitting: Gastroenterology

## 2021-05-31 ENCOUNTER — Encounter: Payer: Self-pay | Admitting: Gastroenterology

## 2021-05-31 VITALS — BP 112/74 | HR 68 | Temp 98.4°F | Resp 10 | Ht 74.0 in | Wt 208.0 lb

## 2021-05-31 DIAGNOSIS — D122 Benign neoplasm of ascending colon: Secondary | ICD-10-CM

## 2021-05-31 DIAGNOSIS — R1032 Left lower quadrant pain: Secondary | ICD-10-CM | POA: Diagnosis not present

## 2021-05-31 DIAGNOSIS — K640 First degree hemorrhoids: Secondary | ICD-10-CM | POA: Diagnosis not present

## 2021-05-31 DIAGNOSIS — K5730 Diverticulosis of large intestine without perforation or abscess without bleeding: Secondary | ICD-10-CM

## 2021-05-31 MED ORDER — SODIUM CHLORIDE 0.9 % IV SOLN: 500.0000 mL | INTRAVENOUS | Status: DC

## 2021-05-31 NOTE — Progress Notes (Signed)
Pt's states no medical or surgical changes since previsit or office visit. 

## 2021-05-31 NOTE — Progress Notes (Signed)
History and Physical Interval Note:  05/31/2021 4:36 PM  Tyrone Graham  has presented today for endoscopic procedure(s), with the diagnosis of  Encounter Diagnosis  Name Primary?   Left lower quadrant pain Yes  .  The various methods of evaluation and treatment have been discussed with the patient and/or family. After consideration of risks, benefits and other options for treatment, the patient has consented to  the endoscopic procedure(s).   The patient's history has been reviewed, patient examined, no change in status, stable for endoscopic procedure(s).  I have reviewed the patient's chart and labs.  Questions were answered to the patient's satisfaction.    No change in patient's symptoms since his clinic visit last month.  [name] E. Candis Schatz, MD [facility]

## 2021-05-31 NOTE — Progress Notes (Signed)
Called to room to assist during endoscopic procedure.  Patient ID and intended procedure confirmed with present staff. Received instructions for my participation in the procedure from the performing physician.  

## 2021-05-31 NOTE — Patient Instructions (Signed)
Handouts on polyps and diverticulosis given.  YOU HAD AN ENDOSCOPIC PROCEDURE TODAY AT [facility]:   Refer to the procedure report that was given to you for any specific questions about what was found during the examination.  If the procedure report does not answer your questions, please call your gastroenterologist to clarify.  If you requested that your care partner not be given the details of your procedure findings, then the procedure report has been included in a sealed envelope for you to review at your convenience later.  YOU SHOULD EXPECT: Some feelings of bloating in the abdomen. Passage of more gas than usual.  Walking can help get rid of the air that was put into your GI tract during the procedure and reduce the bloating. If you had a lower endoscopy (such as a colonoscopy or flexible sigmoidoscopy) you may notice spotting of blood in your stool or on the toilet paper. If you underwent a bowel prep for your procedure, you may not have a normal bowel movement for a few days.  Please Note:  You might notice some irritation and congestion in your nose or some drainage.  This is from the oxygen used during your procedure.  There is no need for concern and it should clear up in a day or so.  SYMPTOMS TO REPORT IMMEDIATELY:  Following lower endoscopy (colonoscopy or flexible sigmoidoscopy):  Excessive amounts of blood in the stool  Significant tenderness or worsening of abdominal pains  Swelling of the abdomen that is new, acute  Fever of 100F or higher   For urgent or emergent issues, a gastroenterologist can be reached at any hour by calling [phone]. Do not use MyChart messaging for urgent concerns.    DIET:  We do recommend a small meal at first, but then you may proceed to your regular diet.  Drink plenty of fluids but you should avoid alcoholic beverages for 24 hours.  ACTIVITY:  You should plan to take it easy for the rest of today and you should NOT DRIVE  or use heavy machinery until tomorrow (because of the sedation medicines used during the test).    FOLLOW UP: Our staff will call the number listed on your records 48-72 hours following your procedure to check on you and address any questions or concerns that you may have regarding the information given to you following your procedure. If we do not reach you, we will leave a message.  We will attempt to reach you two times.  During this call, we will ask if you have developed any symptoms of COVID 19. If you develop any symptoms (ie: fever, flu-like symptoms, shortness of breath, cough etc.) before then, please call [phone].  If you test positive for Covid 19 in the 2 weeks post procedure, please call and report this information to [place].    If any biopsies were taken you will be contacted by phone or by letter within the next 1-3 weeks.  Please call us at [phone] if you have not heard about the biopsies in 3 weeks.    SIGNATURES/CONFIDENTIALITY: You and/or your care partner have signed paperwork which will be entered into your electronic medical record.  These signatures attest to the fact that that the information above on your After Visit Summary has been reviewed and is understood.  Full responsibility of the confidentiality of this discharge information lies with you and/or your care-partner.

## 2021-05-31 NOTE — Progress Notes (Signed)
PT taken to PACU. Monitors in place. VSS. Report given to RN. 

## 2021-05-31 NOTE — Op Note (Signed)
[facility] Patient Name: Tyrone Graham Procedure Date: 05/31/2021 4:37 PM MRN: [id number] Endoscopist: Nicki Reaper E. Candis Schatz , MD Age: 50 Referring MD:  Date of Birth: 11-04-1970 Gender: Male Account #: [id number] Procedure:                Colonoscopy Indications:              Abdominal pain in the left lower quadrant Medicines:                Monitored Anesthesia Care Procedure:                Pre-Anesthesia Assessment:                           - Prior to the procedure, a History and Physical                            was performed, and patient medications and                            allergies were reviewed. The patient's tolerance of                            previous anesthesia was also reviewed. The risks                            and benefits of the procedure and the sedation                            options and risks were discussed with the patient.                            All questions were answered, and informed consent                            was obtained. Prior Anticoagulants: The patient has                            taken no previous anticoagulant or antiplatelet                            agents. ASA Grade Assessment: II - A patient with                            mild systemic disease. After reviewing the risks                            and benefits, the patient was deemed in                            satisfactory condition to undergo the procedure.                           After obtaining informed consent, the colonoscope  was passed under direct vision. Throughout the                            procedure, the patient's blood pressure, pulse, and                            oxygen saturations were monitored continuously. The                            CF [id number] #[id number] was introduced through the anus                            and advanced to the the terminal ileum, with                            identification  of the appendiceal orifice and IC                            valve. The colonoscopy was performed without                            difficulty. The patient tolerated the procedure                            well. The quality of the bowel preparation was                            adequate. The terminal ileum, ileocecal valve,                            appendiceal orifice, and rectum were photographed.                            The bowel preparation used was Sutab via split dose                            instruction. Scope In: 4:44:31 PM Scope Out: 4:58:38 PM Scope Withdrawal Time: 0 hours 11 minutes 6 seconds  Total Procedure Duration: 0 hours 14 minutes 7 seconds  Findings:                 The perianal and digital rectal examinations were                            normal. Pertinent negatives include normal                            sphincter tone and no palpable rectal lesions.                           A 4 mm polyp was found in the ascending colon. The                            polyp was sessile. The polyp was removed with  a                            cold snare. Resection and retrieval were complete.                            Estimated blood loss was minimal.                           A few small and large-mouthed diverticula were                            found in the transverse colon and ascending colon.                           The exam was otherwise normal throughout the                            examined colon.                           The terminal ileum appeared normal.                           Non-bleeding internal hemorrhoids were found during                            retroflexion. The hemorrhoids were Grade I                            (internal hemorrhoids that do not prolapse).                           No additional abnormalities were found on                            retroflexion. Complications:            No immediate complications. Estimated Blood  Loss:     Estimated blood loss was minimal. Impression:               - One 4 mm polyp in the ascending colon, removed                            with a cold snare. Resected and retrieved.                           - Diverticulosis in the transverse colon and in the                            ascending colon.                           - The examined portion of the ileum was normal.                           - Non-bleeding  internal hemorrhoids.                           - No abnormalities to explain patient's new left                            lower quadrant abdominal pain Recommendation:           - Patient has a contact number available for                            emergencies. The signs and symptoms of potential                            delayed complications were discussed with the                            patient. Return to normal activities tomorrow.                            Written discharge instructions were provided to the                            patient.                           - Resume previous diet.                           - Continue present medications.                           - Await pathology results.                           - Repeat colonoscopy (date not yet determined) for                            surveillance based on pathology results.                           - Follow up as needed in GI clinic. Gisela Lea E. Candis Schatz, MD 05/31/2021 5:04:57 PM This report has been signed electronically.

## 2021-06-02 ENCOUNTER — Telehealth: Payer: Self-pay

## 2021-06-02 ENCOUNTER — Encounter: Payer: Self-pay | Admitting: Family Medicine

## 2021-06-02 NOTE — Telephone Encounter (Signed)
°  Follow up Call-  Call back number 05/31/2021  Post procedure Call Back phone  # [phone]  Permission to leave phone message Yes  Some recent data might be hidden     Patient questions:  Do you have a fever, pain , or abdominal swelling? No. Pain Score  0 *  Have you tolerated food without any problems? Yes.    Have you been able to return to your normal activities? Yes.    Do you have any questions about your discharge instructions: Diet   No. Medications  No. Follow up visit  No.  Do you have questions or concerns about your Care? No.  Actions: * If pain score is 4 or above: No action needed, pain <4.

## 2021-06-07 ENCOUNTER — Encounter: Payer: Self-pay | Admitting: Gastroenterology

## 2021-06-07 NOTE — Progress Notes (Signed)
Tyrone Graham, The polyp which I removed during your recent procedure was proven to be completely benign but is considered a "pre-cancerous" polyp that MAY have grown into cancer if it had not been removed.  Studies shows that at least 20% of women over age 51 and 30% of men over age 42 have pre-cancerous polyps.  Based on current nationally recognized surveillance guidelines, I recommend that you have a repeat colonoscopy in 7 years.   If you are still having abdominal pain, I would recommend we get a CT scan of the abdomen and pelvis next.   [name],  Can you please order a CT abd/pelvis for Tyrone Graham?

## 2021-06-08 ENCOUNTER — Other Ambulatory Visit: Payer: Self-pay

## 2021-06-08 DIAGNOSIS — R1032 Left lower quadrant pain: Secondary | ICD-10-CM

## 2021-06-08 NOTE — Telephone Encounter (Signed)
See result note.  

## 2021-06-11 ENCOUNTER — Other Ambulatory Visit: Payer: Self-pay

## 2021-06-11 ENCOUNTER — Telehealth: Payer: Self-pay

## 2021-06-11 DIAGNOSIS — R1032 Left lower quadrant pain: Secondary | ICD-10-CM

## 2021-06-11 NOTE — Telephone Encounter (Signed)
Received message that patient's insurance would not cover CT at [facility] and CT scan would need to be rescheduled to another location. Canceled CT scan at [facility] long and rescheduled CT to [place] off [address] scan is now on 06/22/21 at 8:45 am. Attempted to call patient to let him know about change in CT location and time. Left voicemail for pt to return call.

## 2021-06-16 NOTE — Telephone Encounter (Signed)
Called and spoke with pt to let him know CT scan had to be rescheduled. Pt stated he would not be able to go 06/22/21 because his wife has [place]. Pt stated he would call [place] CT to reschedule.

## 2021-06-17 ENCOUNTER — Ambulatory Visit (HOSPITAL_COMMUNITY): Payer: Managed Care, Other (non HMO)

## 2021-06-22 ENCOUNTER — Inpatient Hospital Stay: Admission: RE | Admit: 2021-06-22 | Payer: Managed Care, Other (non HMO) | Source: Ambulatory Visit

## 2021-07-12 ENCOUNTER — Ambulatory Visit (INDEPENDENT_AMBULATORY_CARE_PROVIDER_SITE_OTHER)
Admission: RE | Admit: 2021-07-12 | Discharge: 2021-07-12 | Disposition: A | Discharge status: Home / Self Care | Payer: 59 | Source: Ambulatory Visit | Attending: Gastroenterology | Admitting: Gastroenterology

## 2021-07-12 ENCOUNTER — Other Ambulatory Visit: Payer: Self-pay

## 2021-07-12 DIAGNOSIS — R1032 Left lower quadrant pain: Secondary | ICD-10-CM

## 2021-07-12 MED ORDER — IOHEXOL 300 MG/ML  SOLN
100.0000 mL | Freq: Once | INTRAMUSCULAR | Status: AC | PRN
  Administered 2021-07-12: 100 mL via INTRAVENOUS

## 2021-07-15 ENCOUNTER — Encounter: Payer: Self-pay | Admitting: Gastroenterology

## 2021-07-15 NOTE — Progress Notes (Signed)
Spoke with patient today regarding his CT results.  The report indicates the presence of a possible small fat-containing inguinal hernia on the left.  His pain does not correlate well with an inguinal hernia (is higher up).  My review of the CT images also shows what appears to be a large simple cyst arising from the right kidney (9-10cm).  This does not localize well to the patient's pain (more right sided), but it does fit the type of pain the patient is describing (a constant pressure, tightness in the abdomen).  I sent a message through Epic to Dr. Kris Hartmann to query the cyst (not mentioned in the report) but I have not heard back yet.  I told the patient we would discuss the CT results further after I had a chance to talk with the radiologist.

## 2021-07-22 NOTE — Progress Notes (Signed)
Spoke with patient again today after reviewing the CT with radiology.  Confirmed that he does have a large simple R renal cyst in addition to a L ing hernia.  Neither of these findings correlate well with his pain.  He is going to make a follow up clinic visit for re-examination and to go over the CT findings again and discuss further referrals/management.

## 2021-08-18 ENCOUNTER — Encounter: Payer: Self-pay | Admitting: Gastroenterology

## 2021-08-18 ENCOUNTER — Ambulatory Visit (INDEPENDENT_AMBULATORY_CARE_PROVIDER_SITE_OTHER): Payer: 59 | Admitting: Gastroenterology

## 2021-08-18 VITALS — BP 130/82 | HR 74 | Ht 74.5 in | Wt 210.0 lb

## 2021-08-18 DIAGNOSIS — R1032 Left lower quadrant pain: Secondary | ICD-10-CM

## 2021-08-18 DIAGNOSIS — R1084 Generalized abdominal pain: Secondary | ICD-10-CM

## 2021-08-18 NOTE — Patient Instructions (Signed)
If you are age 52 or older, your body mass index should be between 23-30. Your Body mass index is 26.6 kg/m?Marland Kitchen If this is out of the aforementioned range listed, please consider follow up with your Primary Care Provider. ? ?If you are age 56 or younger, your body mass index should be between 19-25. Your Body mass index is 26.6 kg/m?Marland Kitchen If this is out of the aformentioned range listed, please consider follow up with your Primary Care Provider.  ? ? ?The [facility] providers would like to encourage you to use [facility] to communicate with providers for non-urgent requests or questions.  Due to long hold times on the telephone, sending your provider a message by [facility] may be a faster and more efficient way to get a response.  Please allow 48 business hours for a response.  Please remember that this is for non-urgent requests.  ? ?It was a pleasure to see you today! ? ?Thank you for trusting me with your gastrointestinal care!   ? ?Scott E.Candis Schatz, MD  ?

## 2021-08-18 NOTE — Progress Notes (Signed)
? ?HPI : Tyrone Graham is a very pleasant 51 year old male with a history of anxiety and depression who I saw in November of last year for persistent left lower quadrant pain.  He underwent a colonoscopy which was unremarkable except for a small polyp and then a CT scan which did not reveal an obvious cause for chronic left sided abdominal pain.  CT scan did reveal a large (10 cm) right renal cyst and did show evidence of a possible left inguinal hernia versus spermatic cord lipoma.  Neither of these incidental findings were thought to be a source of his persistent abdominal pain.  Today, the patient still reports mild and vague left-sided abdominal pain.  His bowel habits are unremarkable without constipation, diarrhea or blood in the stool.  Pain is not typically associated with eating unless he overeats.  Pain is not related to bowel habits.  He does note that the only thing that does seem to make the pain reliably worse is stretching (lying prone on the floor and extending his torso at the waist).  Other movements or physical activities do not seem to reliably precipitate the pain. ?He reports history of a left inguinal hernia repair as an infant. ? ? ? ? ?Past Medical History:  ?Diagnosis Date  ? Anxiety   ? Appendicitis   ? Depression   ? Inguinal hernia   ? ? ? ?Past Surgical History:  ?Procedure Laterality Date  ? APPENDECTOMY    ? INGUINAL HERNIA REPAIR Left   ? ?Family History  ?Problem Relation Age of Onset  ? Mental illness Mother   ? Diabetes Father   ? Heart disease Father   ? Heart failure Father   ? Prostate cancer Paternal Uncle   ? Healthy Daughter   ? Healthy Daughter   ? Prostate cancer Cousin 20  ? Colon polyps Neg Hx   ? Colon cancer Neg Hx   ? ?Social History  ? ?Tobacco Use  ? Smoking status: Never  ? Smokeless tobacco: Never  ?Vaping Use  ? Vaping Use: Never used  ?Substance Use Topics  ? Alcohol use: Yes  ?  Comment: 2-3 per week  ? Drug use: No  ? ?Current Outpatient Medications   ?Medication Sig Dispense Refill  ? Ascorbic Acid (VITAMIN C WITH ROSE HIPS) 1000 MG tablet Take 1,000 mg by mouth as needed.    ? CHARCOAL ACTIVATED PO Take 500 mg by mouth.    ? Flaxseed, Linseed, (EQL FLAXSEED OIL PO) Take 1,400 mg by mouth.    ? ibuprofen (ADVIL,MOTRIN) 200 MG tablet Take 200 mg by mouth every 6 (six) hours as needed.    ? Multiple Vitamins-Minerals (MULTIVITAMIN WITH MINERALS) tablet Take 1 tablet by mouth daily.    ? Nutritional Supplements (VITAMIN D MAINTENANCE PO) Take 50 mcg by mouth.    ? Probiotic Product (PROBIOTIC DAILY PO) Take by mouth.    ? PSYLLIUM PO 250 mg.    ? ?No current facility-administered medications for this visit.  ? ?No Known Allergies ? ? ?Review of Systems: ?All systems reviewed and negative except where noted in HPI.  ? ? ?No results found. ? ?Physical Exam: ?Ht 6' 2.5" (1.892 m)   Wt 210 lb (95.3 kg)   BMI 26.60 kg/m?  ?Constitutional: Pleasant,well-developed, Caucasian male in no acute distress. ?HEENT: Normocephalic and atraumatic. Conjunctivae are normal. No scleral icterus. ?Cardiovascular: Normal rate, regular rhythm.  ?Pulmonary/chest: Effort normal and breath sounds normal. No wheezing, rales or rhonchi. ?Abdominal: Soft,  nondistended, nontender.  No focal tenderness or trigger point.  Carnett's sign negative.  Bowel sounds active throughout. There are no masses palpable. No hepatomegaly. ?Extremities: no edema ?Neurological: Alert and oriented to person place and time. ?Skin: Skin is warm and dry. No rashes noted. ?Psychiatric: Normal mood and affect. Behavior is normal. ? ?CBC ?   ?Component Value Date/Time  ? WBC 6.6 02/02/2021 0846  ? WBC 6.6 03/08/2016 1034  ? RBC 5.46 02/02/2021 0846  ? RBC 5.40 03/08/2016 1034  ? HGB 17.0 02/02/2021 0846  ? HCT 48.5 02/02/2021 0846  ? PLT 248 02/02/2021 0846  ? MCV 89 02/02/2021 0846  ? MCH 31.1 02/02/2021 0846  ? MCH 30.6 03/08/2016 1034  ? MCHC 35.1 02/02/2021 0846  ? MCHC 35.3 03/08/2016 1034  ? RDW 12.8  02/02/2021 0846  ? LYMPHSABS 2.5 02/02/2021 0846  ? MONOABS 594 03/08/2016 1034  ? EOSABS 0.1 02/02/2021 0846  ? BASOSABS 0.1 02/02/2021 0846  ? ? ?CMP  ?   ?Component Value Date/Time  ? NA 140 02/02/2021 0846  ? K 4.8 02/02/2021 0846  ? CL 102 02/02/2021 0846  ? CO2 23 02/02/2021 0846  ? GLUCOSE 95 02/02/2021 0846  ? GLUCOSE 91 03/08/2016 1034  ? BUN 12 02/02/2021 0846  ? CREATININE 0.96 02/02/2021 0846  ? CREATININE 0.85 03/08/2016 1034  ? CALCIUM 9.7 02/02/2021 0846  ? PROT 7.6 02/02/2021 0846  ? ALBUMIN 4.5 02/02/2021 0846  ? AST 21 02/02/2021 0846  ? ALT 24 02/02/2021 0846  ? ALKPHOS 88 02/02/2021 0846  ? BILITOT 0.8 02/02/2021 0846  ? GFRNONAA 101 01/29/2020 1613  ? GFRAA 117 01/29/2020 1613  ? ?Narrative & Impression ?CLINICAL DATA:  Left lower quadrant pain for 5 months. ?  ?EXAM: ?CT ABDOMEN AND PELVIS WITH CONTRAST ?  ?TECHNIQUE: ?Multidetector CT imaging of the abdomen and pelvis was performed ?using the standard protocol following bolus administration of ?intravenous contrast. ?  ?RADIATION DOSE REDUCTION: This exam was performed according to the ?departmental dose-optimization program which includes automated ?exposure control, adjustment of the mA and/or kV according to ?patient size and/or use of iterative reconstruction technique. ?  ?CONTRAST:  145mL OMNIPAQUE IOHEXOL 300 MG/ML  SOLN ?  ?COMPARISON:  None. ?  ?FINDINGS: ?Lower Chest: No acute findings. ?  ?Hepatobiliary: No hepatic masses identified. Gallbladder is ?unremarkable. No evidence of biliary ductal dilatation. ?  ?Pancreas:  No mass or inflammatory changes. ?  ?Spleen: Within normal limits in size and appearance. ?  ?Adrenals/Urinary Tract: No masses identified. No evidence of ?ureteral calculi or hydronephrosis. ?  ?Stomach/Bowel: No evidence of obstruction, inflammatory process or ?abnormal fluid collections. Normal appendix visualized. ?  ?Vascular/Lymphatic: No pathologically enlarged lymph nodes. No acute ?vascular findings. ?   ?Reproductive:  No mass or other significant abnormality. ?  ?Other: Small fat-containing left inguinal hernia or spermatic cord ?lipoma incidentally noted. ?  ?Musculoskeletal:  No suspicious bone lesions identified. ?  ?IMPRESSION: ?No evidence of diverticulitis or other acute findings. ?  ?Small fat-containing left inguinal hernia vs. spermatic cord lipoma. ?  ?  ?Electronically Signed ?  By: Marlaine Hind M.D. ?  On: 07/12/2021 16:29 ? ? ?ASSESSMENT AND PLAN: ?51 year old male with vague, mild but persistent left-sided abdominal pain without any other concerning GI symptoms, with normal colonoscopy and normal CT scan.  Although the CT scan did reveal a large right-sided renal cyst and a suspected left inguinal hernia versus spermatic cord lipoma, his symptoms do not localize well to either  of these findings and I do not think that they are responsible for his pain, but I would defer to his primary care provider if he thinks surgical evaluation is indicated for either of these findings.   ?The patient originally thought that his pain was musculoskeletal, and I am inclined to agree with him.  Pain is reliably elicited by certain stretching exercises.  I would recommend him following up with his primary provider and perhaps seeking physical therapy to help strengthen the core muscles.  I do not think any further GI evaluation or treatment is necessary or indicated at this time.  Recommended to the patient that if his symptoms change or if he does develop more prominent GI symptoms to follow-up as needed.  Otherwise, he will need a surveillance colonoscopy in 7 years. ? ?Left-sided abdominal pain, suspect musculoskeletal ?- Follow-up with Dr. Redmond School, consider physical therapy referral ? ?Colon polyp ?- Surveillance colonoscopy in 7 years ? ?Tyrone Graham E. Candis Schatz, MD ?[facility] ? ?CC:  Denita Lung, MD ? ?

## 2021-08-19 ENCOUNTER — Encounter: Payer: Self-pay | Admitting: Gastroenterology

## 2021-08-30 ENCOUNTER — Encounter: Payer: Self-pay | Admitting: Family Medicine

## 2022-02-03 ENCOUNTER — Encounter: Payer: Managed Care, Other (non HMO) | Admitting: Family Medicine

## 2022-02-10 ENCOUNTER — Ambulatory Visit (INDEPENDENT_AMBULATORY_CARE_PROVIDER_SITE_OTHER): Payer: 59 | Admitting: Family Medicine

## 2022-02-10 ENCOUNTER — Encounter: Payer: Self-pay | Admitting: Family Medicine

## 2022-02-10 VITALS — BP 110/70 | HR 71 | Temp 97.2°F | Ht 74.5 in | Wt 204.8 lb

## 2022-02-10 DIAGNOSIS — E739 Lactose intolerance, unspecified: Secondary | ICD-10-CM | POA: Diagnosis not present

## 2022-02-10 DIAGNOSIS — F325 Major depressive disorder, single episode, in full remission: Secondary | ICD-10-CM

## 2022-02-10 DIAGNOSIS — Z860100 Personal history of colon polyps, unspecified: Secondary | ICD-10-CM | POA: Insufficient documentation

## 2022-02-10 DIAGNOSIS — Z0000 Encounter for general adult medical examination without abnormal findings: Secondary | ICD-10-CM

## 2022-02-10 DIAGNOSIS — Z23 Encounter for immunization: Secondary | ICD-10-CM

## 2022-02-10 DIAGNOSIS — Z13220 Encounter for screening for lipoid disorders: Secondary | ICD-10-CM

## 2022-02-10 NOTE — Progress Notes (Signed)
Complete physical exam  Patient: Tyrone Graham   DOB: Feb 10, 1971   51 y.o. Male  MRN: [id number]  Subjective:       Tyrone Graham is a 51 y.o. male who presents today for a complete physical exam. He reports consuming a general diet. Home exercise routine includes walking 2-3 hrs per week. He generally feels fairly well. He reports sleeping well. He does not have additional problems to discuss today.  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  Does have a history of difficulty with depression and has intermittently been involved in counseling.  Presently he is dealing with his adult children that are still relying upon him.  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  He does not smoke or drink and keeps himself fairly active.  Work is going well.  Home life is going well.  Family and social history as well as health maintenance and immunizations was reviewed   Most recent fall risk assessment:    12/19/2017    9:03 AM  Fall Risk   Falls in the past year? 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   Past Medical History:  Diagnosis Date   Anxiety    Appendicitis    Depression    Inguinal hernia    Past Surgical History:  Procedure Laterality Date   APPENDECTOMY     INGUINAL HERNIA REPAIR Left    Social History   Tobacco Use   Smoking status: Never   Smokeless tobacco: Never  Vaping Use   Vaping Use: Never used  Substance Use Topics   Alcohol use: Yes    Comment: 2-3 per week   Drug use: No   Family History  Problem Relation Age of Onset   Mental illness Mother    Diabetes  Father    Heart disease Father    Heart failure Father    Prostate cancer Paternal Uncle    Healthy Daughter    Healthy Daughter    Prostate cancer Cousin 60   Colon polyps Neg Hx    Colon cancer Neg Hx    No Known Allergies    Patient Care Team: Ronnald Nian, MD as PCP - General (Family Medicine)   Outpatient Medications Prior to Visit  Medication Sig Note   Ascorbic Acid (VITAMIN C WITH ROSE HIPS) 1000 MG tablet Take 1,000 mg by mouth as needed.    Flaxseed, Linseed, (EQL FLAXSEED OIL PO) Take 1,400 mg by mouth.    ibuprofen (ADVIL,MOTRIN) 200 MG tablet Take 200 mg by mouth every 6 (six) hours as needed. 02/10/2022: Prn last dose a  month ago   Multiple Vitamins-Minerals (MULTIVITAMIN WITH MINERALS) tablet Take 1 tablet by mouth daily.    Nutritional Supplements (VITAMIN D MAINTENANCE PO) Take 50 mcg by mouth.    PSYLLIUM PO 250 mg.    CHARCOAL ACTIVATED PO Take 500 mg by mouth. (Patient not taking: Reported on 02/10/2022) 02/10/2022: Prn last dose a year ago  Probiotic Product (PROBIOTIC DAILY PO) Take by mouth. (Patient not taking: Reported on 02/10/2022) 02/10/2022: Prn last dose over six months ago   No facility-administered medications prior to visit.    Review of Systems  All other systems reviewed and are negative.         Objective:     BP 110/70   Pulse 71   Temp (!) 97.2 F (36.2 C)   Ht 6' 2.5" (1.892 m)   Wt 204 lb 12.8 oz (92.9 kg)   SpO2 95%   BMI 25.94 kg/m  BP Readings from Last 3 Encounters:  02/10/22 110/70  08/18/21 130/82  05/31/21 112/74   Wt Readings from Last 3 Encounters:  02/10/22 204 lb 12.8 oz (92.9 kg)  08/18/21 210 lb (95.3 kg)  05/31/21 208 lb (94.3 kg)    The 10-year ASCVD risk score (Arnett DK, et al., 2019) is: 2.9%   Values used to calculate the score:     Age: 63 years     Sex: Male     Is Non-Hispanic African American: No     Diabetic: No     Tobacco smoker: No     Systolic Blood Pressure: 110 mmHg     Is BP  treated: No     HDL Cholesterol: 37 mg/dL     Total Cholesterol: 146 mg/dL   Physical Exam  Routine general medical examination at a health care facility - Plan: CBC with Differential/Platelet, Comprehensive metabolic panel, Lipid panel  Depression, major, in remission (HCC)  Lactose intolerance  History of colonic polyps - Plan: CBC with Differential/Platelet, Comprehensive metabolic panel  Screening for lipid disorders - Plan: Lipid panel  Last CBC Lab Results  Component Value Date   WBC 6.6 02/02/2021   HGB 17.0 02/02/2021   HCT 48.5 02/02/2021   MCV 89 02/02/2021   MCH 31.1 02/02/2021   RDW 12.8 02/02/2021   PLT 248 02/02/2021   Last metabolic panel Lab Results  Component Value Date   GLUCOSE 95 02/02/2021   NA 140 02/02/2021   K 4.8 02/02/2021   CL 102 02/02/2021   CO2 23 02/02/2021   BUN 12 02/02/2021   CREATININE 0.96 02/02/2021   EGFR 96 02/02/2021   CALCIUM 9.7 02/02/2021   PROT 7.6 02/02/2021   ALBUMIN 4.5 02/02/2021   LABGLOB 3.1 02/02/2021   AGRATIO 1.5 02/02/2021   BILITOT 0.8 02/02/2021   ALKPHOS 88 02/02/2021   AST 21 02/02/2021   ALT 24 02/02/2021   Last lipids Lab Results  Component Value Date   CHOL 146 02/02/2021   HDL 37 (L) 02/02/2021   LDLCALC 87 02/02/2021   TRIG 122 02/02/2021   CHOLHDL 3.9 02/02/2021        Assessment & Plan:    Routine general medical examination at a health care facility - Plan: CBC with Differential/Platelet, Comprehensive metabolic panel, Lipid panel  Depression, major, in remission (HCC)  Lactose intolerance  History of colonic polyps - Plan: CBC with Differential/Platelet, Comprehensive metabolic panel  Screening for lipid disorders - Plan: Lipid panel  Immunization History  Administered Date(s) Administered   PFIZER Comirnaty(Gray Top)Covid-19 Tri-Sucrose Vaccine 02/02/2021   PFIZER(Purple Top)SARS-COV-2 Vaccination 09/14/2019, 10/05/2019   Tdap 12/19/2017    Health Maintenance  Topic Date  Due   Zoster Vaccines- Shingrix (1 of 2) Never done   COVID-19 Vaccine (4 - Pfizer series) 02/26/2022 (Originally 03/30/2021)   INFLUENZA VACCINE  09/18/2022 (Originally 01/18/2022)   HIV Screening  02/11/2023 (Originally 07/25/1984)  TETANUS/TDAP  12/20/2027   COLONOSCOPY (Pts 45-3yrs Insurance coverage will need to be confirmed)  05/31/2028   Hepatitis C Screening  Completed   HPV VACCINES  Aged Out   Fecal DNA (Cologuard)  Discontinued  Discussed immunizations and at this point he will hold off on shingles as well as flu and COVID.  Then discussed his adult children that he still is stressed out over.  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  Discussed health benefits of physical activity, and encouraged him to engage in regular exercise appropriate for his age and condition.  Problem List Items Addressed This Visit     Depression, major, in remission ([place])   History of colonic polyps   Relevant Orders   CBC with Differential/Platelet   Comprehensive metabolic panel   Lactose intolerance   Other Visit Diagnoses     Routine general medical examination at a health care facility    -  Primary   Relevant Orders   CBC with Differential/Platelet   Comprehensive metabolic panel   Lipid panel   Screening for lipid disorders       Relevant Orders   Lipid panel      Return in about 1 year (around 02/11/2023) for cpe.     Jill Alexanders, MD

## 2022-02-10 NOTE — Patient Instructions (Signed)
Health Maintenance, Male Adopting a healthy lifestyle and getting preventive care are important in promoting health and wellness. Ask your health care provider about: The right schedule for you to have regular tests and exams. Things you can do on your own to prevent diseases and keep yourself healthy. What should I know about diet, weight, and exercise? Eat a healthy diet  Eat a diet that includes plenty of vegetables, fruits, low-fat dairy products, and lean protein. Do not eat a lot of foods that are high in solid fats, added sugars, or sodium. Maintain a healthy weight Body mass index (BMI) is a measurement that can be used to identify possible weight problems. It estimates body fat based on height and weight. Your health care provider can help determine your BMI and help you achieve or maintain a healthy weight. Get regular exercise Get regular exercise. This is one of the most important things you can do for your health. Most adults should: Exercise for at least 150 minutes each week. The exercise should increase your heart rate and make you sweat (moderate-intensity exercise). Do strengthening exercises at least twice a week. This is in addition to the moderate-intensity exercise. Spend less time sitting. Even light physical activity can be beneficial. Watch cholesterol and blood lipids Have your blood tested for lipids and cholesterol at 52 years of age, then have this test every 5 years. You may need to have your cholesterol levels checked more often if: Your lipid or cholesterol levels are high. You are older than 52 years of age. You are at high risk for heart disease. What should I know about cancer screening? Many types of cancers can be detected early and may often be prevented. Depending on your health history and family history, you may need to have cancer screening at various ages. This may include screening for: Colorectal cancer. Prostate cancer. Skin cancer. Lung  cancer. What should I know about heart disease, diabetes, and high blood pressure? Blood pressure and heart disease High blood pressure causes heart disease and increases the risk of stroke. This is more likely to develop in people who have high blood pressure readings or are overweight. Talk with your health care provider about your target blood pressure readings. Have your blood pressure checked: Every 3-5 years if you are 18-39 years of age. Every year if you are 40 years old or older. If you are between the ages of 65 and 75 and are a current or former smoker, ask your health care provider if you should have a one-time screening for abdominal aortic aneurysm (AAA). Diabetes Have regular diabetes screenings. This checks your fasting blood sugar level. Have the screening done: Once every three years after age 45 if you are at a normal weight and have a low risk for diabetes. More often and at a younger age if you are overweight or have a high risk for diabetes. What should I know about preventing infection? Hepatitis B If you have a higher risk for hepatitis B, you should be screened for this virus. Talk with your health care provider to find out if you are at risk for hepatitis B infection. Hepatitis C Blood testing is recommended for: Everyone born from 1945 through 1965. Anyone with known risk factors for hepatitis C. Sexually transmitted infections (STIs) You should be screened each year for STIs, including gonorrhea and chlamydia, if: You are sexually active and are younger than 52 years of age. You are older than 52 years of age and your   health care provider tells you that you are at risk for this type of infection. Your sexual activity has changed since you were last screened, and you are at increased risk for chlamydia or gonorrhea. Ask your health care provider if you are at risk. Ask your health care provider about whether you are at high risk for HIV. Your health care provider  may recommend a prescription medicine to help prevent HIV infection. If you choose to take medicine to prevent HIV, you should first get tested for HIV. You should then be tested every 3 months for as long as you are taking the medicine. Follow these instructions at home: Alcohol use Do not drink alcohol if your health care provider tells you not to drink. If you drink alcohol: Limit how much you have to 0-2 drinks a day. Know how much alcohol is in your drink. In the [place] one drink equals one 12 oz bottle of beer (355 mL), one 5 oz glass of wine (148 mL), or one 1 oz glass of hard liquor (44 mL). Lifestyle Do not use any products that contain nicotine or tobacco. These products include cigarettes, chewing tobacco, and vaping devices, such as e-cigarettes. If you need help quitting, ask your health care provider. Do not use street drugs. Do not share needles. Ask your health care provider for help if you need support or information about quitting drugs. General instructions Schedule regular health, dental, and eye exams. Stay current with your vaccines. Tell your health care provider if: You often feel depressed. You have ever been abused or do not feel safe at home. Summary Adopting a healthy lifestyle and getting preventive care are important in promoting health and wellness. Follow your health care provider's instructions about healthy diet, exercising, and getting tested or screened for diseases. Follow your health care provider's instructions on monitoring your cholesterol and blood pressure. This information is not intended to replace advice given to you by your health care provider. Make sure you discuss any questions you have with your health care provider. Document Revised: 10/26/2020 Document Reviewed: 10/26/2020 Elsevier Patient Education  2023 [employer] Inc[employer]  

## 2022-02-11 LAB — CBC WITH DIFFERENTIAL/PLATELET
Basophils Absolute: 0.1 10*3/uL (ref 0.0–0.2)
Basos: 1 %
EOS (ABSOLUTE): 0.1 10*3/uL (ref 0.0–0.4)
Eos: 1 %
Hematocrit: 47.3 % (ref 37.5–51.0)
Hemoglobin: 16.7 g/dL (ref 13.0–17.7)
Immature Grans (Abs): 0 10*3/uL (ref 0.0–0.1)
Immature Granulocytes: 0 %
Lymphocytes Absolute: 2.5 10*3/uL (ref 0.7–3.1)
Lymphs: 37 %
MCH: 31.6 pg (ref 26.6–33.0)
MCHC: 35.3 g/dL (ref 31.5–35.7)
MCV: 89 fL (ref 79–97)
Monocytes Absolute: 0.7 10*3/uL (ref 0.1–0.9)
Monocytes: 10 %
Neutrophils Absolute: 3.4 10*3/uL (ref 1.4–7.0)
Neutrophils: 51 %
Platelets: 278 10*3/uL (ref 150–450)
RBC: 5.29 x10E6/uL (ref 4.14–5.80)
RDW: 12.7 % (ref 11.6–15.4)
WBC: 6.8 10*3/uL (ref 3.4–10.8)

## 2022-02-11 LAB — LIPID PANEL
Chol/HDL Ratio: 3.8 ratio (ref 0.0–5.0)
Cholesterol, Total: 146 mg/dL (ref 100–199)
HDL: 38 mg/dL — ABNORMAL LOW (ref >39)
LDL Chol Calc (NIH): 92 mg/dL (ref 0–99)
Triglycerides: 83 mg/dL (ref 0–149)
VLDL Cholesterol Cal: 16 mg/dL (ref 5–40)

## 2022-02-11 LAB — COMPREHENSIVE METABOLIC PANEL
ALT: 26 IU/L (ref 0–44)
AST: 23 IU/L (ref 0–40)
Albumin/Globulin Ratio: 1.3 (ref 1.2–2.2)
Albumin: 4.4 g/dL (ref 3.8–4.9)
Alkaline Phosphatase: 93 IU/L (ref 44–121)
BUN/Creatinine Ratio: 17 (ref 9–20)
BUN: 14 mg/dL (ref 6–24)
Bilirubin Total: 0.7 mg/dL (ref 0.0–1.2)
CO2: 24 mmol/L (ref 20–29)
Calcium: 9.6 mg/dL (ref 8.7–10.2)
Chloride: 103 mmol/L (ref 96–106)
Creatinine, Ser: 0.82 mg/dL (ref 0.76–1.27)
Globulin, Total: 3.4 g/dL (ref 1.5–4.5)
Glucose: 95 mg/dL (ref 70–99)
Potassium: 4.7 mmol/L (ref 3.5–5.2)
Sodium: 142 mmol/L (ref 134–144)
Total Protein: 7.8 g/dL (ref 6.0–8.5)
eGFR: 106 mL/min/{1.73_m2} (ref >59)

## 2022-03-29 ENCOUNTER — Encounter: Payer: Self-pay | Admitting: Internal Medicine

## 2022-04-11 ENCOUNTER — Encounter: Payer: Self-pay | Admitting: Internal Medicine

## 2022-08-25 IMAGING — CT CT ABD-PELV W/ CM
2 of 5 series · 16 of 46 positions shown, 18 images · IV contrast (OMNIPAQUE 300)
Comparison: None.

CLINICAL DATA: Left lower quadrant pain for 5 months.

EXAM:
CT ABDOMEN AND PELVIS WITH CONTRAST
TECHNIQUE: Multidetector CT imaging of the abdomen and pelvis was performed
using the standard protocol following bolus administration of
intravenous contrast.

[Series 2: abd/pel w · axial · 0.72mm/px · z∈[-512,-77]mm · 13 of 99 slices shown, 15 images]
[im 6/99  soft-tissue]
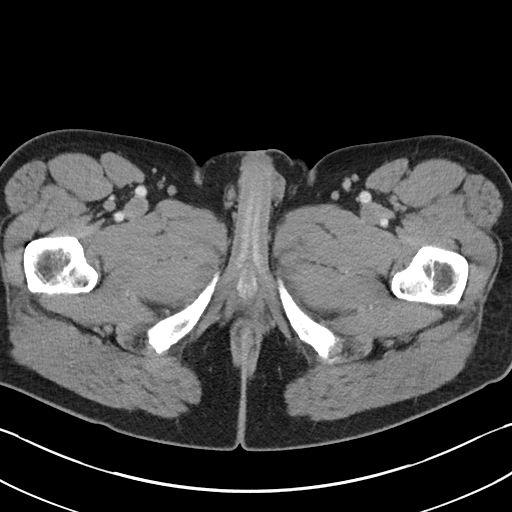
[im 6/99  bone]
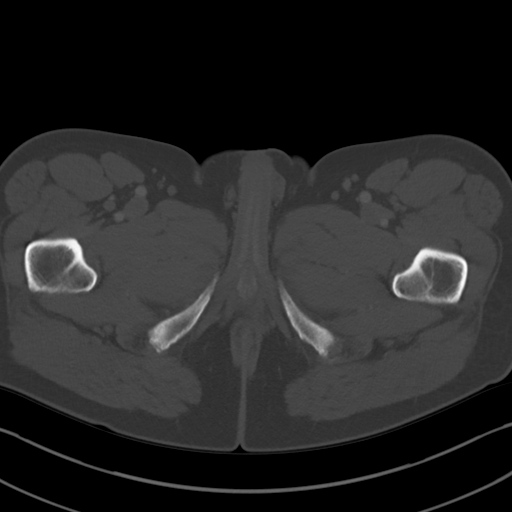
[im 16/99  soft-tissue]
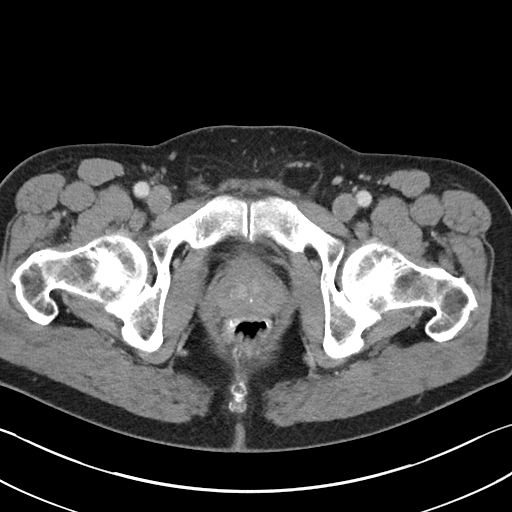
[im 21/99  soft-tissue]
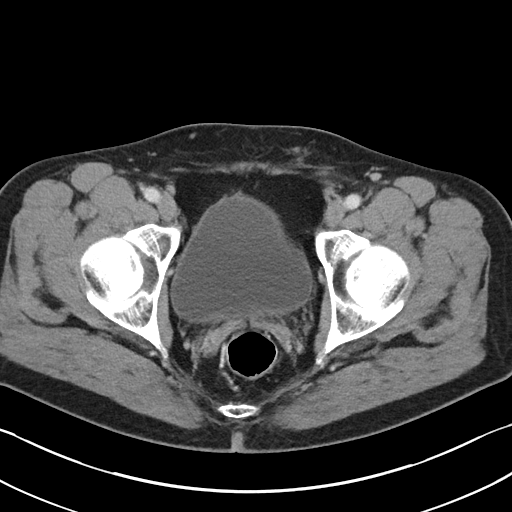
[im 26/99  soft-tissue]
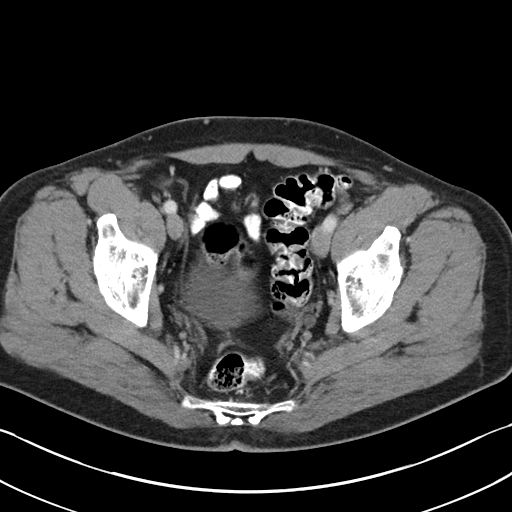
[im 37/99  soft-tissue]
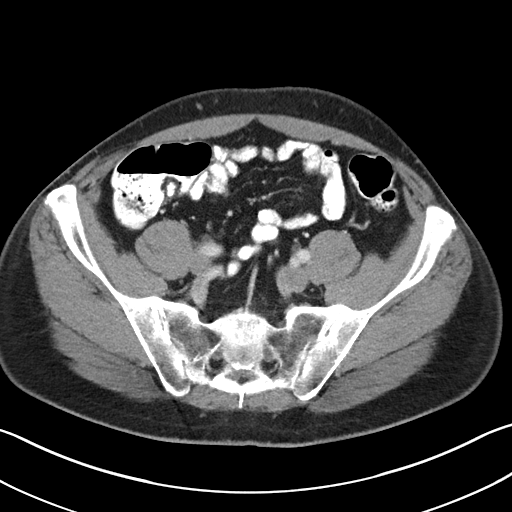
[im 42/99  soft-tissue]
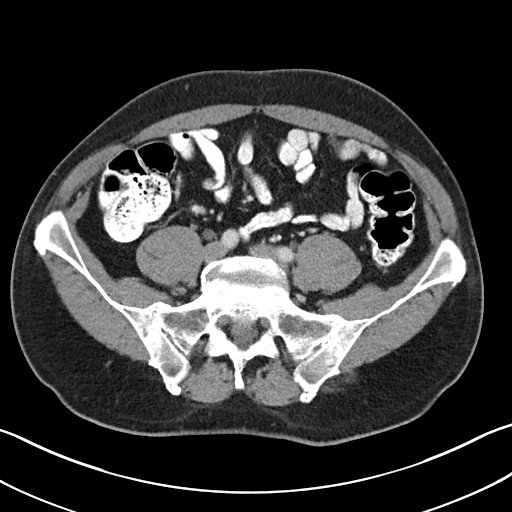
[im 52/99  soft-tissue]
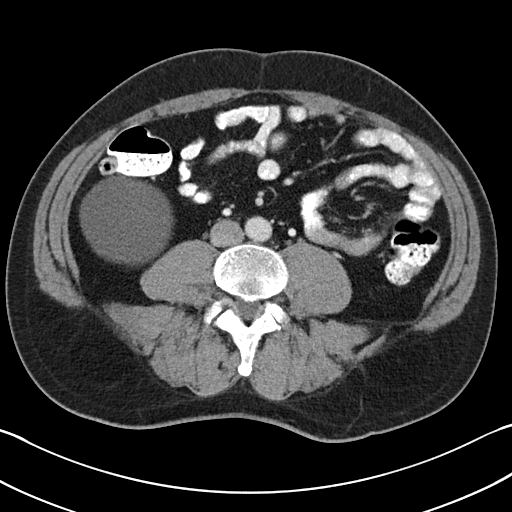
[im 57/99  soft-tissue]
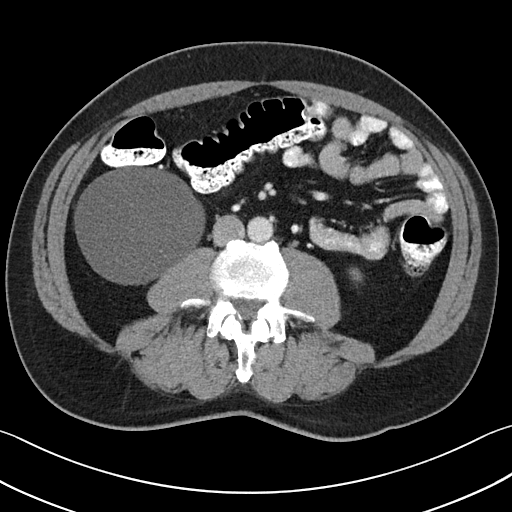
[im 62/99  soft-tissue]
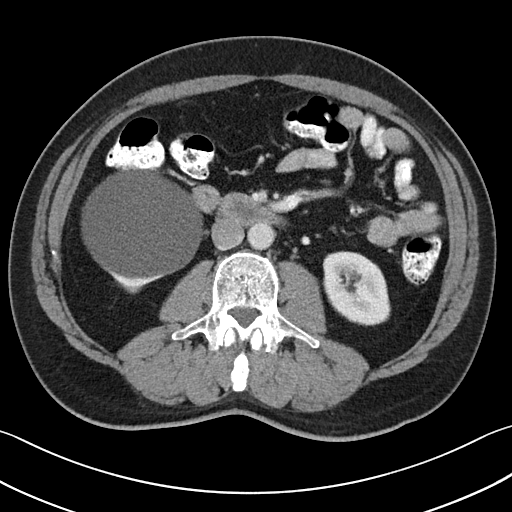
[im 62/99  bone]
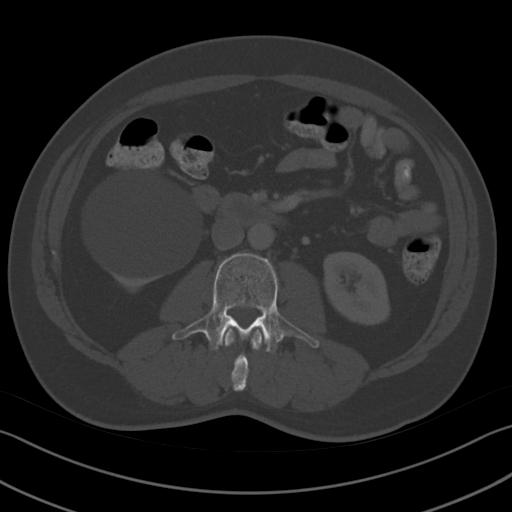
[im 73/99  soft-tissue]
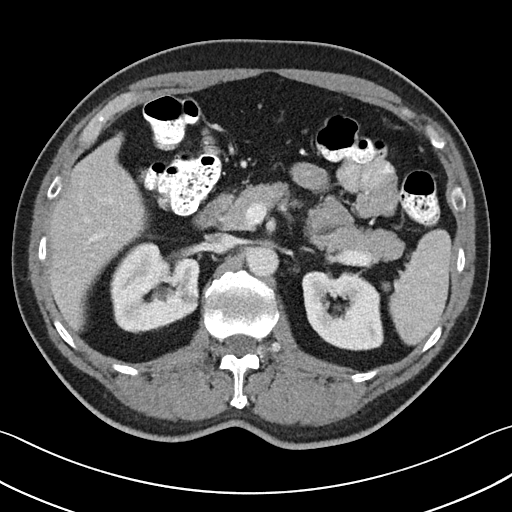
[im 78/99  soft-tissue]
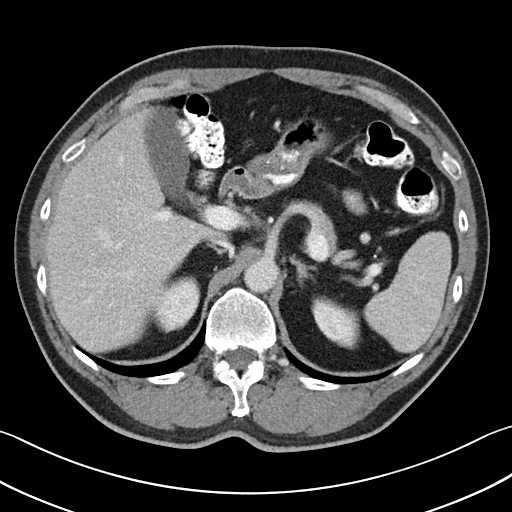
[im 83/99  soft-tissue]
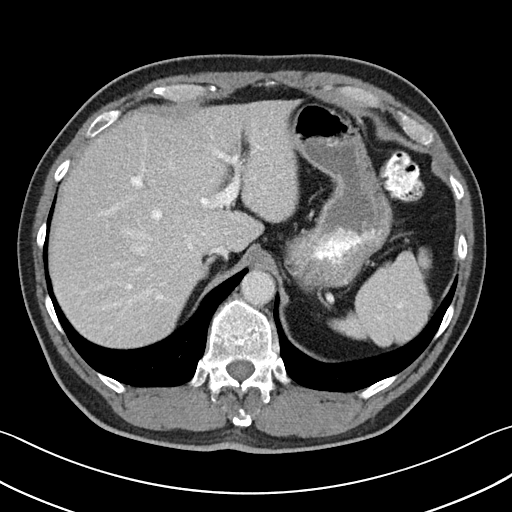
[im 93/99  soft-tissue]
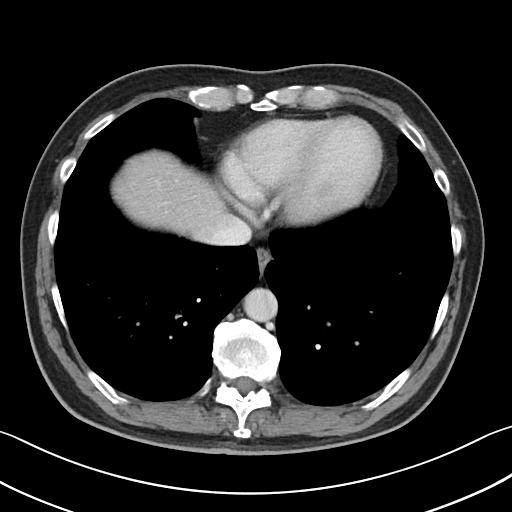

[Series 5: coronal st · coronal · 0.75mm/px · 3 of 94 slices shown]
[im 32/94  soft-tissue]
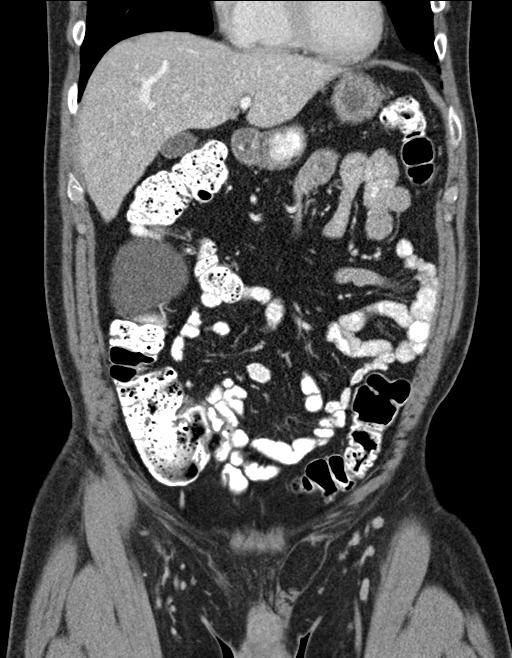
[im 42/94  soft-tissue]
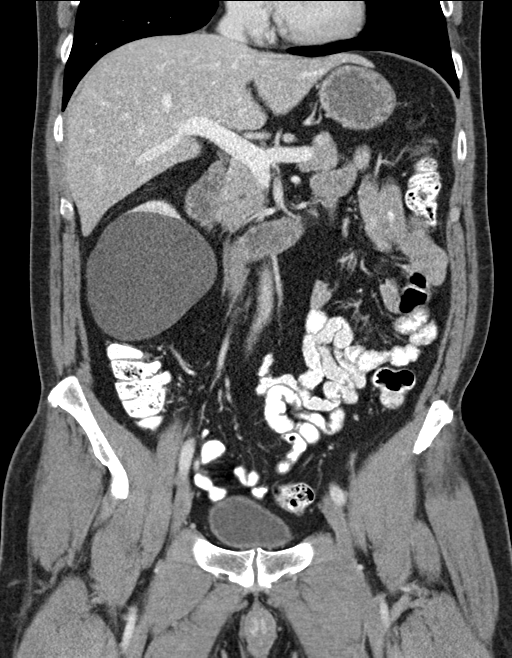
[im 52/94  soft-tissue]
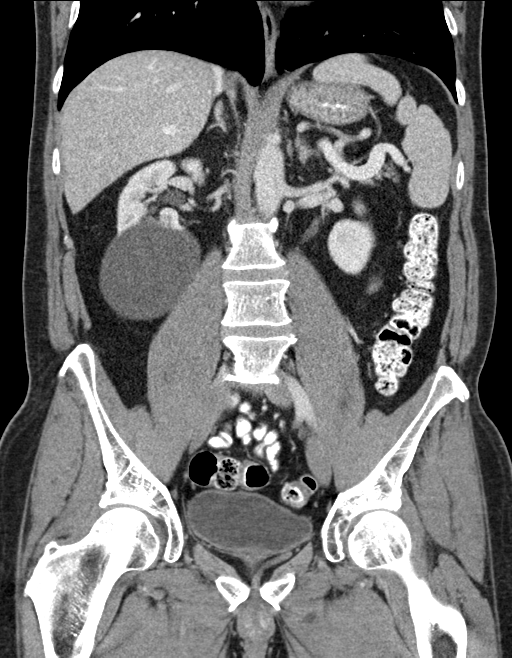

[16 of 46 positions shown; findings below may reference images not displayed]

RADIATION DOSE REDUCTION: This exam was performed according to the
departmental dose-optimization program which includes automated
exposure control, adjustment of the mA and/or kV according to
patient size and/or use of iterative reconstruction technique.

CONTRAST:  100mL OMNIPAQUE IOHEXOL 300 MG/ML  SOLN
FINDINGS: Lower Chest: No acute findings.

Hepatobiliary: No hepatic masses identified. Gallbladder is
unremarkable. No evidence of biliary ductal dilatation.

Pancreas:  No mass or inflammatory changes.

Spleen: Within normal limits in size and appearance.

Adrenals/Urinary Tract: No masses identified. No evidence of
ureteral calculi or hydronephrosis.

Stomach/Bowel: No evidence of obstruction, inflammatory process or
abnormal fluid collections. Normal appendix visualized.

Vascular/Lymphatic: No pathologically enlarged lymph nodes. No acute
vascular findings.

Reproductive:  No mass or other significant abnormality.

Other: Small fat-containing left inguinal hernia or spermatic cord
lipoma incidentally noted.

Musculoskeletal:  No suspicious bone lesions identified.
IMPRESSION: No evidence of diverticulitis or other acute findings.

Small fat-containing left inguinal hernia vs. spermatic cord lipoma.

## 2022-11-25 ENCOUNTER — Ambulatory Visit (INDEPENDENT_AMBULATORY_CARE_PROVIDER_SITE_OTHER): Payer: 59 | Admitting: Medical

## 2022-11-25 VITALS — BP 130/80 | HR 81 | Temp 98.2°F | Resp 16 | Wt 202.8 lb

## 2022-11-25 DIAGNOSIS — R052 Subacute cough: Secondary | ICD-10-CM | POA: Diagnosis not present

## 2022-11-25 DIAGNOSIS — J988 Other specified respiratory disorders: Secondary | ICD-10-CM

## 2022-11-25 MED ORDER — BENZONATATE 200 MG PO CAPS: 200.0000 mg | ORAL_CAPSULE | Freq: Three times a day (TID) | ORAL | 0 refills | Status: DC | PRN

## 2022-11-25 MED ORDER — PREDNISONE 20 MG PO TABS: ORAL_TABLET | ORAL | 0 refills | Status: DC

## 2022-11-25 NOTE — Patient Instructions (Addendum)
Your symptoms suggest residual cough and lung inflammation, some post nasal drainage from recent head cold  Recommendations Drink plenty of water throughout the day as this can help resolve mucous Begin Tessalon Perles cough drops up to 3 times daily as needed for the next week until resolved Begin a short 3 day taper of prednisone steroid to reduce inflammation in the lungs Begin Benadryl over the counter at bedtime for the next 4-6 days for some residual post nasal drainage that could be irritating the lungs and airway If not much improved by Tuesday or Wednesday of next week, then please let me know

## 2022-11-25 NOTE — Progress Notes (Signed)
Subjective:  Tyrone Graham is a 52 y.o. male who presents for Chief Complaint  Patient presents with   Cough    Cough x 2 weeks- chest cough and no other symptoms.      Here for about 2 weeks of cough, chest congestion, nothing seems to come out.  Started with some nasal congestion, watery nose, sinus pressure and productive nasal drainage, sneezing initially 2 weeks ago after cleaning out office cubbies, but then cough has lingered.  No fever, no sore throat, no wheezing, no body aches or chills.  No  currently drippy nose or water irritated eyes.  Using nothing for symptoms  had tried some mucinex prior to this week.  Nonsmoker, no hx/o asthma.   No other aggravating or relieving factors.    No other c/o.  Past Medical History:  Diagnosis Date   Anxiety    Appendicitis    Depression    Inguinal hernia    Current Outpatient Medications on File Prior to Visit  Medication Sig Dispense Refill   CHARCOAL ACTIVATED PO Take 500 mg by mouth.     Flaxseed, Linseed, (EQL FLAXSEED OIL PO) Take 1,400 mg by mouth.     ibuprofen (ADVIL,MOTRIN) 200 MG tablet Take 200 mg by mouth every 6 (six) hours as needed.     Multiple Vitamins-Minerals (MULTIVITAMIN WITH MINERALS) tablet Take 1 tablet by mouth daily.     Nutritional Supplements (VITAMIN D MAINTENANCE PO) Take 50 mcg by mouth.     Probiotic Product (PROBIOTIC DAILY PO) Take by mouth.     PSYLLIUM PO 250 mg.     Ascorbic Acid (VITAMIN C WITH ROSE HIPS) 1000 MG tablet Take 1,000 mg by mouth as needed. (Patient not taking: Reported on 11/25/2022)     No current facility-administered medications on file prior to visit.     The following portions of the patient's history were reviewed and updated as appropriate: allergies, current medications, past family history, past medical history, past social history, past surgical history and problem list.  ROS Otherwise as in subjective above  Objective: BP 130/80   Pulse 81   Temp 98.2 F (36.8  C)   Resp 16   Wt 202 lb 12.8 oz (92 kg)   SpO2 98%   BMI 25.69 kg/m   General appearance: alert, no distress, well developed, well nourished HEENT: normocephalic, sclerae anicteric, conjunctiva pink and moist, TMs pearly, nares patent, no discharge or erythema, pharynx normal Oral cavity: MMM, no lesions Neck: supple, no lymphadenopathy, no thyromegaly, no masses Heart: RRR, normal S1, S2, no murmurs Lungs: CTA bilaterally, no wheezes, rhonchi, or rales Abdomen: +bs, soft, non tender, non distended, no masses, no hepatomegaly, no splenomegaly Pulses: 2+ radial pulses, 2+ pedal pulses, normal cap refill Ext: no edema   Assessment: Encounter Diagnoses  Name Primary?   Subacute cough Yes   Respiratory tract infection      Plan: Discussed symptoms.  Normal exam today Discussed recommendations below   Patient Instructions  Your symptoms suggest residual cough and lung inflammation, some post nasal drainage from recent head cold  Recommendations Drink plenty of water throughout the day as this can help resolve mucous Begin Tessalon Perles cough drops up to 3 times daily as needed for the next week until resolved Begin a short 3 day taper of prednisone steroid to reduce inflammation in the lungs Begin Benadryl over the counter at bedtime for the next 4-6 days for some residual post nasal drainage that could be  irritating the lungs and airway If not much improved by Tuesday or Wednesday of next week, then please let me know     Tyrone Graham was seen today for cough.  Diagnoses and all orders for this visit:  Subacute cough  Respiratory tract infection  Other orders -     benzonatate (TESSALON) 200 MG capsule; Take 1 capsule (200 mg total) by mouth 3 (three) times daily as needed for cough. -     predniSONE (DELTASONE) 20 MG tablet; 3 tablets today, 2 tablets tomorrow, 1 tablet the third day    Follow up: prn

## 2023-02-15 ENCOUNTER — Ambulatory Visit (INDEPENDENT_AMBULATORY_CARE_PROVIDER_SITE_OTHER): Payer: 59 | Admitting: Family Medicine

## 2023-02-15 ENCOUNTER — Encounter: Payer: Self-pay | Admitting: Family Medicine

## 2023-02-15 VITALS — BP 112/70 | HR 76 | Ht 75.0 in | Wt 203.8 lb

## 2023-02-15 DIAGNOSIS — E739 Lactose intolerance, unspecified: Secondary | ICD-10-CM

## 2023-02-15 DIAGNOSIS — Z0000 Encounter for general adult medical examination without abnormal findings: Secondary | ICD-10-CM

## 2023-02-15 DIAGNOSIS — Z23 Encounter for immunization: Secondary | ICD-10-CM | POA: Diagnosis not present

## 2023-02-15 DIAGNOSIS — F325 Major depressive disorder, single episode, in full remission: Secondary | ICD-10-CM | POA: Diagnosis not present

## 2023-02-15 DIAGNOSIS — Z86010 Personal history of colonic polyps: Secondary | ICD-10-CM

## 2023-02-15 NOTE — Progress Notes (Signed)
Complete physical exam  Patient: Tyrone Graham   DOB: 1971/01/01   52 y.o. Male  MRN: [id number]  Subjective:    Chief Complaint  Patient presents with   Annual Exam    Fasting annual exam. Does not want flu shot but will take shingrix. Has fam hx of heart disease and would like to know at what point he needs to see cardiology?    Tyrone Graham is a 52 y.o. male who presents today for a complete physical exam. He reports consuming a general diet.  Exercise, walks 3 days a week.  He generally feels well. He reports sleeping well. He does not have additional problems to discuss today.  He is in the process of having dental work done and is missing a few of his front teeth because of this.  He has a history of colonic polyp and is on a 7-year schedule with repeat in 2029.  He does have lactose intolerance and does a good job of taking care of that.  Otherwise he has no particular concerns or complaints.   Most recent fall risk assessment:    02/15/2023    8:24 AM  Fall Risk   Falls in the past year? 0  Number falls in past yr: 0  Injury with Fall? 0  Risk for fall due to : No Fall Risks  Follow up Falls evaluation completed     Most recent depression screenings:    02/15/2023    8:25 AM 02/10/2022    9:26 AM  PHQ 2/9 Scores  PHQ - 2 Score 2 2  PHQ- 9 Score 5 3    Vision:Within last year    Patient Care Team: Ronnald Nian, MD as PCP - General (Family Medicine)   Outpatient Medications Prior to Visit  Medication Sig Note   Flaxseed, Linseed, (EQL FLAXSEED OIL PO) Take 1,400 mg by mouth.    Multiple Vitamins-Minerals (MULTIVITAMIN WITH MINERALS) tablet Take 1 tablet by mouth daily.    Nutritional Supplements (VITAMIN D MAINTENANCE PO) Take 50 mcg by mouth.    PSYLLIUM PO 250 mg.    ascorbic acid (VITAMIN C) 500 MG tablet Take 500 mg by mouth daily. (Patient not taking: Reported on 02/15/2023) 02/15/2023: As needed    CHARCOAL ACTIVATED PO Take 500 mg by  mouth. (Patient not taking: Reported on 02/15/2023) 02/15/2023: As needed   ibuprofen (ADVIL,MOTRIN) 200 MG tablet Take 200 mg by mouth every 6 (six) hours as needed. (Patient not taking: Reported on 02/15/2023) 02/15/2023: As needed   [DISCONTINUED] Ascorbic Acid (VITAMIN C WITH ROSE HIPS) 1000 MG tablet Take 1,000 mg by mouth as needed. (Patient not taking: Reported on 11/25/2022)    [DISCONTINUED] benzonatate (TESSALON) 200 MG capsule Take 1 capsule (200 mg total) by mouth 3 (three) times daily as needed for cough.    [DISCONTINUED] predniSONE (DELTASONE) 20 MG tablet 3 tablets today, 2 tablets tomorrow, 1 tablet the third day    [DISCONTINUED] Probiotic Product (PROBIOTIC DAILY PO) Take by mouth.    No facility-administered medications prior to visit.    Review of Systems  All other systems reviewed and are negative. Family and social history as well as health maintenance and immunizations was reviewed        Objective:     BP 112/70   Pulse 76   Ht 6\' 3"  (1.905 m)   Wt 203 lb 12.8 oz (92.4 kg)   BMI 25.47 kg/m    Physical Exam  Alert and in no distress. Tympanic membranes and canals are normal. Pharyngeal area is normal. Neck is supple without adenopathy or thyromegaly. Cardiac exam shows a regular sinus rhythm without murmurs or gallops. Lungs are clear to auscultation.  Abdominal exam shows no masses or tenderness.      Assessment & Plan:    Routine general medical examination at a health care facility - Plan: CBC with Differential/Platelet, Comprehensive metabolic panel, Lipid panel  Lactose intolerance  History of colonic polyps  Depression, major, in remission (HCC)  Need for shingles vaccine - Plan: Zoster Recombinant (Shingrix )  Immunization History  Administered Date(s) Administered   PFIZER Comirnaty(Gray Top)Covid-19 Tri-Sucrose Vaccine 02/02/2021   PFIZER(Purple Top)SARS-COV-2 Vaccination 09/14/2019, 10/05/2019   Tdap 12/19/2017   Zoster  Recombinant(Shingrix) 02/15/2023    Health Maintenance  Topic Date Due   HIV Screening  Never done   COVID-19 Vaccine (4 - 2023-24 season) 03/03/2023 (Originally 02/18/2022)   INFLUENZA VACCINE  09/18/2023 (Originally 01/19/2023)   Zoster Vaccines- Shingrix (2 of 2) 04/12/2023   DTaP/Tdap/Td (2 - Td or Tdap) 12/20/2027   Colonoscopy  05/31/2028   Hepatitis C Screening  Completed   HPV VACCINES  Aged Out   Fecal DNA (Cologuard)  Discontinued    Discussed health benefits of physical activity, and encouraged him to engage in regular exercise appropriate for his age and condition.  Problem List Items Addressed This Visit     Depression, major, in remission (HCC)   History of colonic polyps   Lactose intolerance   Other Visit Diagnoses     Routine general medical examination at a health care facility    -  Primary   Relevant Orders   CBC with Differential/Platelet   Comprehensive metabolic panel   Lipid panel   Need for shingles vaccine       Relevant Orders   Zoster Recombinant (Shingrix ) (Completed)     Encouraged him to continue to take good care of himself and get involved in regular exercise program. Follow-up 1 year     Sharlot Gowda, MD

## 2023-02-16 LAB — LIPID PANEL
Chol/HDL Ratio: 3.8 ratio (ref 0.0–5.0)
Cholesterol, Total: 153 mg/dL (ref 100–199)
HDL: 40 mg/dL (ref >39)
LDL Chol Calc (NIH): 98 mg/dL (ref 0–99)
Triglycerides: 79 mg/dL (ref 0–149)
VLDL Cholesterol Cal: 15 mg/dL (ref 5–40)

## 2023-02-16 LAB — COMPREHENSIVE METABOLIC PANEL WITH GFR
ALT: 24 IU/L (ref 0–44)
AST: 22 IU/L (ref 0–40)
Albumin: 4.3 g/dL (ref 3.8–4.9)
Alkaline Phosphatase: 95 IU/L (ref 44–121)
BUN/Creatinine Ratio: 14 (ref 9–20)
BUN: 13 mg/dL (ref 6–24)
Bilirubin Total: 1 mg/dL (ref 0.0–1.2)
CO2: 25 mmol/L (ref 20–29)
Calcium: 9.6 mg/dL (ref 8.7–10.2)
Chloride: 100 mmol/L (ref 96–106)
Creatinine, Ser: 0.95 mg/dL (ref 0.76–1.27)
Globulin, Total: 3.1 g/dL (ref 1.5–4.5)
Glucose: 94 mg/dL (ref 70–99)
Potassium: 5.1 mmol/L (ref 3.5–5.2)
Sodium: 137 mmol/L (ref 134–144)
Total Protein: 7.4 g/dL (ref 6.0–8.5)
eGFR: 96 mL/min/1.73

## 2023-02-16 LAB — CBC WITH DIFFERENTIAL/PLATELET
Basophils Absolute: 0.1 x10E3/uL (ref 0.0–0.2)
Basos: 1 %
EOS (ABSOLUTE): 0.1 x10E3/uL (ref 0.0–0.4)
Eos: 2 %
Hematocrit: 48.2 % (ref 37.5–51.0)
Hemoglobin: 16.5 g/dL (ref 13.0–17.7)
Immature Grans (Abs): 0 x10E3/uL (ref 0.0–0.1)
Immature Granulocytes: 0 %
Lymphocytes Absolute: 2.2 x10E3/uL (ref 0.7–3.1)
Lymphs: 42 %
MCH: 31 pg (ref 26.6–33.0)
MCHC: 34.2 g/dL (ref 31.5–35.7)
MCV: 90 fL (ref 79–97)
Monocytes Absolute: 0.6 x10E3/uL (ref 0.1–0.9)
Monocytes: 11 %
Neutrophils Absolute: 2.3 x10E3/uL (ref 1.4–7.0)
Neutrophils: 44 %
Platelets: 280 x10E3/uL (ref 150–450)
RBC: 5.33 x10E6/uL (ref 4.14–5.80)
RDW: 13.1 % (ref 11.6–15.4)
WBC: 5.2 x10E3/uL (ref 3.4–10.8)

## 2023-04-18 ENCOUNTER — Other Ambulatory Visit (INDEPENDENT_AMBULATORY_CARE_PROVIDER_SITE_OTHER): Payer: 59

## 2023-04-18 DIAGNOSIS — Z23 Encounter for immunization: Secondary | ICD-10-CM | POA: Diagnosis not present

## 2023-05-03 ENCOUNTER — Ambulatory Visit: Payer: 59 | Admitting: Sports Medicine

## 2023-05-03 ENCOUNTER — Other Ambulatory Visit: Payer: Self-pay

## 2023-05-03 VITALS — BP 120/80 | Ht 75.0 in | Wt 200.0 lb

## 2023-05-03 DIAGNOSIS — M25511 Pain in right shoulder: Secondary | ICD-10-CM

## 2023-05-03 DIAGNOSIS — M7501 Adhesive capsulitis of right shoulder: Secondary | ICD-10-CM | POA: Diagnosis not present

## 2023-05-03 MED ORDER — METHYLPREDNISOLONE ACETATE 40 MG/ML IJ SUSP
40.0000 mg | Freq: Once | INTRAMUSCULAR | Status: AC
  Administered 2023-05-03: 40 mg via INTRA_ARTICULAR

## 2023-05-03 NOTE — Progress Notes (Signed)
PCP: Ronnald Nian, MD  SUBJECTIVE:   HPI:  Patient is a 52 y.o. male here with chief complaint of right shoulder pain and loss of motion. He was referred over by his chiropractor, Thereasa Distance with concern for adhesive capsulitis.  He reports two weeks of a sore right shoulder causing him to limit his use of the arm. He woke up two days ago and had nearly no mobility of the right shoulder. He saw Riki Rusk yesterday to try to work on it and notes this morning his motion is about 20% better. Denies neck pain or radiating numbness/tingling. He denies history of shoulder injury that he is aware of. No prior shoulder surgeries. He works as a Sport and exercise psychologist.   ROS:     See HPI  PERTINENT  PMH / PSH FH / / SH:  Past Medical, Surgical, Social, and Family History Reviewed & Updated in the EMR.  Pertinent findings include:  Non-contributory  No Known Allergies   OBJECTIVE:  BP 120/80   Ht 6\' 3"  (1.905 m)   Wt 200 lb (90.7 kg)   BMI 25.00 kg/m   PHYSICAL EXAM:  GEN: Alert and Oriented, NAD, comfortable in exam room RESP: Unlabored respirations, symmetric chest rise PSY: normal mood, congruent affect   Right Shoulder MSK EXAM: No swelling, ecchymoses.  No gross deformity. No TTP. ROM greatly reduced: flexion 70d, abduction 60d, ER 5d, IR to iliac crest. Passively can get about 5-10d further in each plane. Strength 5/5 with empty can and resisted IR, 4+/5 with external rotation. NV intact distally.  Limited MSK [place] of Shoulder, Right Patient was seated on exam table and shoulder US examination was performed using high frequency linear probe. Images saved to PACS. -Biceps tendon was visualized within the bicipital groove in both LAX and SAX with intact tendon fibers without signs of irregularity and no hypoechoic changes.  -Unable to adequately visualize the subscapularis tendon. -Supraspinatus tendon was visualized in SAX and LAX with intact tendon fibers and no signs of  irregularity or hypoechoic changes -Infraspinatus visualized with intact tendon fibers though mild intratendinous hypoechoic changes at its distal attachment to the greater tubercle of the humerus. -Teres minor visualized with no signs of tearing, hypoechoic changes or tissue irregularity seen.  -Posterior glenohumeral joint was visualized with proper alignment, though mild thickening of joint capsule.  [facility] Joint was visualized without bursal distension or bony spurs/arthritic changes.    IMPRESSION:  - Mild Infraspinatus tendinopathy without evidence of tearing   U/S performed and interpreted by Glean Salen, MD.   ASSESSMENT & PLAN:  1. Right shoulder pain, acute 2. Adhesive capsulitis of right shoulder Agree with Jeremy's concern for adhesive capsulitis of the right shoulder, likely secondary to infraspinatus tendinopathy. We discussed management of this being physical therapy with progressive increase in ROM and stretching of the joint capsule. He would like to work on home stretches and follow with his Chiropractor for now which is reasonable. He was interested in cortisone injection which was performed as below. I provided him codman, wall walks, and IR/ER exercises to work on over the next 4 weeks. He will follow-up at that time and depending on how he is doing consider formal referral to PT or starting to build on infraspinatus rehab exercises. Patient's questions were answered and they are in agreement with this plan.   US-Guided High Volume Right Glenohumeral Injection After discussion on risks/benefits/alternatives, informed verbal and written consent was obtained. A timeout was then performed confirming correct  patient, procedure, and site. Patient was seated on table in exam room in left lateral decubitus position with right arm adducted across their chest. The patient's shoulder was prepped with alcohol swabs and utilizing lateral approach with ultrasound guidance, the patient's  right glenohumeral joint was injected with 4:4:1 mixture of lidocaine 1%:marcaine 0.5%,depomedrol 40mg /mL via an in-plane approach. Patient tolerated the procedure well without immediate complications.  The patient was counseled as to the expected post-injection course, including the possibility of worsening of pain with steroid flare. Instructed as to concerning symptoms and advised to contact the office if these should arise.   Glean Salen, MD PGY-4, Sports Medicine Fellow [facility]  Addendum:  I was the preceptor for this visit and available for immediate consultation.  Norton Blizzard MD Marrianne Mood

## 2023-05-03 NOTE — Patient Instructions (Signed)
Today you received an injection with a corticosteroid (aka: cortisone injection). This injection is usually done in response to pain and inflammation. There is some "numbing medicine" (Lidocaine) in the shot, so the injected area may be numb and feel really good for the next couple of hours. The numbing medicine usually wears off in 2-3 hours, and then your pain level may be back to where it was before the injection until the cortisone starts working.    The actually benefit from the steroid injection is usually noticed within 3-5 days, but may take up to 14 days. You may actually experience a small (as in 10%) INCREASE in pain in the first 24 hours---that is common.  Things to watch out for that you should contact us or a health care provider urgently would include: 1. Unusual (as in more than 10%) increase in pain 2. New fever > 101.5 3. New swelling or redness of the injected area. 4. Streaking of red lines around the area injected.  Do not hesitate to call or reach out with any questions or concerns.     Please follow up in 4-6 weeks if you aren't doing any better.

## 2023-08-15 ENCOUNTER — Encounter: Payer: Self-pay | Admitting: Internal Medicine

## 2024-03-19 ENCOUNTER — Ambulatory Visit (INDEPENDENT_AMBULATORY_CARE_PROVIDER_SITE_OTHER): Payer: 59 | Admitting: Family Medicine

## 2024-03-19 VITALS — BP 120/80 | HR 67 | Ht 75.0 in | Wt 204.6 lb

## 2024-03-19 DIAGNOSIS — Z0000 Encounter for general adult medical examination without abnormal findings: Secondary | ICD-10-CM | POA: Diagnosis not present

## 2024-03-19 DIAGNOSIS — I10 Essential (primary) hypertension: Secondary | ICD-10-CM

## 2024-03-19 DIAGNOSIS — Z136 Encounter for screening for cardiovascular disorders: Secondary | ICD-10-CM | POA: Diagnosis not present

## 2024-03-19 DIAGNOSIS — Z23 Encounter for immunization: Secondary | ICD-10-CM

## 2024-03-19 NOTE — Progress Notes (Signed)
 Name: Tyrone Graham   Date of Visit: 03/19/24   Date of last visit with me: Visit date not found   CHIEF COMPLAINT:  Chief Complaint  Patient presents with   Annual Exam    Cpe, fasting.        HPI:  Discussed the use of AI scribe software for clinical note transcription with the patient, who gave verbal consent to proceed.  History of Present Illness   Tyrone Graham is a 53 year old male who presents with concerns about stress and elevated blood pressure.  He experiences stress, which he believes contributes to elevated blood pressure readings. He describes pressure in his neck, head, and shoulders during these times. Home blood pressure monitoring using his father's machine has shown readings as high as 137/90 mmHg and 140/90 mmHg.  He denies fatigue and states that he sleeps well, often falling asleep easily and waking up ready to start his day. His weight has remained stable, with minor fluctuations of four to five pounds annually. He mentions a recent stomach bug in February, which resulted in a temporary weight loss of three pounds.  He is concerned about his heart health, particularly given his family history of heart disease, as his father died of heart disease. He engages in physical activity by playing tennis once a week and doing yard work, although he notes he has only recently returned to playing tennis after a four-year hiatus.  No urinary symptoms and no recent COVID vaccination.         OBJECTIVE:       03/19/2024    9:26 AM  Depression screen PHQ 2/9  Decreased Interest 0  Down, Depressed, Hopeless 0  PHQ - 2 Score 0  Altered sleeping 0  Tired, decreased energy 0  Change in appetite 0  Feeling bad or failure about yourself  0  Trouble concentrating 0  Moving slowly or fidgety/restless 0  Suicidal thoughts 0  PHQ-9 Score 0     BP Readings from Last 3 Encounters:  03/19/24 120/80  05/03/23 120/80  02/15/23 112/70    BP 120/80    Pulse 67   Ht 6' 3 (1.905 m)   Wt 204 lb 9.6 oz (92.8 kg)   SpO2 97%   BMI 25.57 kg/m    Physical Exam          Physical Exam Constitutional:      Appearance: Normal appearance.  Neurological:     General: No focal deficit present.     Mental Status: He is alert and oriented to person, place, and time. Mental status is at baseline.     ASSESSMENT/PLAN:   Assessment & Plan Annual physical exam  Flu vaccine need  Primary hypertension  Encounter for screening for cardiovascular disorders    Assessment and Plan    Elevated blood pressure Intermittent elevated readings, highest at 143/84 mmHg, likely age-related. Family history of heart disease. No immediate medication adjustment needed. - Monitor blood pressure readings morning and evening every other day or every three days. - Schedule follow-up in six weeks to review blood pressure readings. - Consider antihypertensive medication if persistent elevation is noted in future readings.  Stress Stress contributing to elevated blood pressure, related to life circumstances.  General Health Maintenance Discussed flu, pneumococcal, and COVID vaccines. Pneumococcal vaccine not indicated. Routine labs planned. PSA and A1c screenings deferred. - Administer flu vaccine today. - Perform routine labs including lipids, electrolytes, and general blood work. - Include HIV  screening in labs. - Defer PSA screening until urinary symptoms occur. - Defer A1c screening until age 15 unless symptoms occur.         Konnar Ben A. Vita MD [facility]

## 2024-03-20 ENCOUNTER — Ambulatory Visit: Payer: Self-pay | Admitting: Family Medicine

## 2024-03-20 LAB — BASIC METABOLIC PANEL WITH GFR
BUN/Creatinine Ratio: 13 (ref 9–20)
BUN: 12 mg/dL (ref 6–24)
CO2: 25 mmol/L (ref 20–29)
Calcium: 9.7 mg/dL (ref 8.7–10.2)
Chloride: 101 mmol/L (ref 96–106)
Creatinine, Ser: 0.93 mg/dL (ref 0.76–1.27)
Glucose: 95 mg/dL (ref 70–99)
Potassium: 5 mmol/L (ref 3.5–5.2)
Sodium: 139 mmol/L (ref 134–144)
eGFR: 98 mL/min/1.73 (ref >59)

## 2024-03-20 LAB — LIPID PANEL
Chol/HDL Ratio: 3.3 ratio (ref 0.0–5.0)
Cholesterol, Total: 136 mg/dL (ref 100–199)
HDL: 41 mg/dL (ref >39)
LDL Chol Calc (NIH): 83 mg/dL (ref 0–99)
Triglycerides: 58 mg/dL (ref 0–149)
VLDL Cholesterol Cal: 12 mg/dL (ref 5–40)

## 2024-03-20 LAB — CBC WITH DIFFERENTIAL/PLATELET
Basophils Absolute: 0.1 x10E3/uL (ref 0.0–0.2)
Basos: 2 %
EOS (ABSOLUTE): 0.1 x10E3/uL (ref 0.0–0.4)
Eos: 2 %
Hematocrit: 48.5 % (ref 37.5–51.0)
Hemoglobin: 16.7 g/dL (ref 13.0–17.7)
Immature Grans (Abs): 0 x10E3/uL (ref 0.0–0.1)
Immature Granulocytes: 0 %
Lymphocytes Absolute: 2.2 x10E3/uL (ref 0.7–3.1)
Lymphs: 40 %
MCH: 31 pg (ref 26.6–33.0)
MCHC: 34.4 g/dL (ref 31.5–35.7)
MCV: 90 fL (ref 79–97)
Monocytes Absolute: 0.6 x10E3/uL (ref 0.1–0.9)
Monocytes: 10 %
Neutrophils Absolute: 2.6 x10E3/uL (ref 1.4–7.0)
Neutrophils: 46 %
Platelets: 268 x10E3/uL (ref 150–450)
RBC: 5.39 x10E6/uL (ref 4.14–5.80)
RDW: 12.7 % (ref 11.6–15.4)
WBC: 5.5 x10E3/uL (ref 3.4–10.8)

## 2024-03-20 LAB — HIV ANTIBODY (ROUTINE TESTING W REFLEX): HIV Screen 4th Generation wRfx: NONREACTIVE

## 2025-03-19 ENCOUNTER — Encounter: Payer: Self-pay | Admitting: Family Medicine
